# Patient Record
Sex: Female | Born: 1945 | Race: White | Hispanic: No | Marital: Married | Smoking: Never smoker
Health system: Southern US, Community
[De-identification: ages and names within clinical notes are randomized; demographics above are authoritative.]

## PROBLEM LIST (undated history)

## (undated) DIAGNOSIS — F329 Major depressive disorder, single episode, unspecified: Secondary | ICD-10-CM

## (undated) DIAGNOSIS — G20 Parkinson's disease: Secondary | ICD-10-CM

## (undated) DIAGNOSIS — R296 Repeated falls: Secondary | ICD-10-CM

## (undated) DIAGNOSIS — C50919 Malignant neoplasm of unspecified site of unspecified female breast: Secondary | ICD-10-CM

## (undated) DIAGNOSIS — I10 Essential (primary) hypertension: Secondary | ICD-10-CM

## (undated) DIAGNOSIS — F32A Depression, unspecified: Secondary | ICD-10-CM

## (undated) DIAGNOSIS — G231 Progressive supranuclear ophthalmoplegia [Steele-Richardson-Olszewski]: Secondary | ICD-10-CM

## (undated) HISTORY — DX: Depression, unspecified: F32.A

## (undated) HISTORY — DX: Progressive supranuclear ophthalmoplegia (steele-Richardson-olszewski): G23.1

## (undated) HISTORY — DX: Major depressive disorder, single episode, unspecified: F32.9

## (undated) HISTORY — PX: SPINAL FUSION: SHX223

## (undated) HISTORY — DX: Parkinson's disease: G20

## (undated) HISTORY — DX: Essential (primary) hypertension: I10

## (undated) HISTORY — DX: Malignant neoplasm of unspecified site of unspecified female breast: C50.919

---

## 1996-11-01 DIAGNOSIS — C50919 Malignant neoplasm of unspecified site of unspecified female breast: Secondary | ICD-10-CM

## 1996-11-01 HISTORY — PX: OTHER SURGICAL HISTORY: SHX169

## 1996-11-01 HISTORY — DX: Malignant neoplasm of unspecified site of unspecified female breast: C50.919

## 2008-11-01 HISTORY — PX: ANTERIOR RELEASE VERTEBRAL BODY W/ POSTERIOR FUSION: SUR290

## 2010-11-01 DIAGNOSIS — G20 Parkinson's disease: Secondary | ICD-10-CM

## 2010-11-01 DIAGNOSIS — G20A1 Parkinson's disease without dyskinesia, without mention of fluctuations: Secondary | ICD-10-CM

## 2010-11-01 HISTORY — DX: Parkinson's disease: G20

## 2010-11-01 HISTORY — DX: Parkinson's disease without dyskinesia, without mention of fluctuations: G20.A1

## 2012-10-13 ENCOUNTER — Ambulatory Visit: Payer: 59 | Attending: Neurology | Admitting: Physical Therapy

## 2012-10-13 DIAGNOSIS — R269 Unspecified abnormalities of gait and mobility: Secondary | ICD-10-CM | POA: Insufficient documentation

## 2012-10-13 DIAGNOSIS — G20 Parkinson's disease: Secondary | ICD-10-CM | POA: Insufficient documentation

## 2012-10-13 DIAGNOSIS — IMO0001 Reserved for inherently not codable concepts without codable children: Secondary | ICD-10-CM | POA: Insufficient documentation

## 2012-10-13 DIAGNOSIS — G20A1 Parkinson's disease without dyskinesia, without mention of fluctuations: Secondary | ICD-10-CM | POA: Insufficient documentation

## 2012-10-26 ENCOUNTER — Ambulatory Visit: Payer: 59 | Admitting: Physical Therapy

## 2012-10-27 ENCOUNTER — Ambulatory Visit: Payer: 59 | Admitting: Physical Therapy

## 2012-11-02 ENCOUNTER — Ambulatory Visit: Payer: 59 | Attending: Neurology | Admitting: Physical Therapy

## 2012-11-02 DIAGNOSIS — G20A1 Parkinson's disease without dyskinesia, without mention of fluctuations: Secondary | ICD-10-CM | POA: Insufficient documentation

## 2012-11-02 DIAGNOSIS — R269 Unspecified abnormalities of gait and mobility: Secondary | ICD-10-CM | POA: Insufficient documentation

## 2012-11-02 DIAGNOSIS — IMO0001 Reserved for inherently not codable concepts without codable children: Secondary | ICD-10-CM | POA: Insufficient documentation

## 2012-11-02 DIAGNOSIS — G20 Parkinson's disease: Secondary | ICD-10-CM | POA: Insufficient documentation

## 2012-11-03 ENCOUNTER — Ambulatory Visit: Payer: 59 | Admitting: Physical Therapy

## 2012-11-07 ENCOUNTER — Ambulatory Visit: Payer: 59 | Admitting: Physical Therapy

## 2012-11-09 ENCOUNTER — Ambulatory Visit: Payer: 59 | Admitting: Physical Therapy

## 2012-11-09 ENCOUNTER — Telehealth: Payer: Self-pay | Admitting: *Deleted

## 2012-11-09 NOTE — Telephone Encounter (Signed)
Confirmed 3.10.14 appt w/ pt.  Mailed before appt letter & packet to pt.  Took paperwork to Med Rec for chart.

## 2012-11-10 ENCOUNTER — Ambulatory Visit: Payer: 59 | Admitting: Physical Therapy

## 2012-11-14 ENCOUNTER — Ambulatory Visit: Payer: 59 | Admitting: Physical Therapy

## 2012-11-16 ENCOUNTER — Ambulatory Visit: Payer: 59 | Admitting: Physical Therapy

## 2012-11-17 ENCOUNTER — Ambulatory Visit: Payer: 59 | Admitting: Physical Therapy

## 2012-11-20 ENCOUNTER — Other Ambulatory Visit: Payer: Self-pay | Admitting: *Deleted

## 2012-11-20 DIAGNOSIS — C50919 Malignant neoplasm of unspecified site of unspecified female breast: Secondary | ICD-10-CM

## 2012-11-20 DIAGNOSIS — C50911 Malignant neoplasm of unspecified site of right female breast: Secondary | ICD-10-CM | POA: Insufficient documentation

## 2012-11-21 ENCOUNTER — Ambulatory Visit: Payer: 59 | Admitting: Occupational Therapy

## 2012-11-21 ENCOUNTER — Ambulatory Visit: Payer: 59 | Admitting: Physical Therapy

## 2012-11-22 ENCOUNTER — Ambulatory Visit: Payer: 59 | Admitting: Physical Therapy

## 2012-11-24 ENCOUNTER — Ambulatory Visit: Payer: 59 | Admitting: Physical Therapy

## 2012-11-27 ENCOUNTER — Ambulatory Visit: Payer: 59 | Admitting: Physical Therapy

## 2012-11-28 ENCOUNTER — Ambulatory Visit: Payer: 59 | Admitting: Physical Therapy

## 2012-11-28 ENCOUNTER — Ambulatory Visit: Payer: 59 | Admitting: Occupational Therapy

## 2012-11-29 ENCOUNTER — Ambulatory Visit: Payer: 59 | Admitting: Physical Therapy

## 2012-11-29 ENCOUNTER — Ambulatory Visit: Payer: 59 | Admitting: Occupational Therapy

## 2012-12-01 ENCOUNTER — Ambulatory Visit: Payer: 59 | Admitting: Physical Therapy

## 2012-12-04 ENCOUNTER — Ambulatory Visit: Payer: 59 | Attending: Neurology | Admitting: Occupational Therapy

## 2012-12-04 DIAGNOSIS — R269 Unspecified abnormalities of gait and mobility: Secondary | ICD-10-CM | POA: Insufficient documentation

## 2012-12-04 DIAGNOSIS — G20 Parkinson's disease: Secondary | ICD-10-CM | POA: Insufficient documentation

## 2012-12-04 DIAGNOSIS — IMO0001 Reserved for inherently not codable concepts without codable children: Secondary | ICD-10-CM | POA: Insufficient documentation

## 2012-12-04 DIAGNOSIS — G20A1 Parkinson's disease without dyskinesia, without mention of fluctuations: Secondary | ICD-10-CM | POA: Insufficient documentation

## 2012-12-06 ENCOUNTER — Ambulatory Visit: Payer: 59 | Admitting: Occupational Therapy

## 2012-12-11 ENCOUNTER — Ambulatory Visit: Payer: 59 | Admitting: Occupational Therapy

## 2012-12-13 ENCOUNTER — Ambulatory Visit: Payer: 59 | Admitting: Occupational Therapy

## 2012-12-19 ENCOUNTER — Ambulatory Visit: Payer: 59 | Admitting: Occupational Therapy

## 2012-12-21 ENCOUNTER — Ambulatory Visit: Payer: 59 | Admitting: Occupational Therapy

## 2013-01-08 ENCOUNTER — Encounter: Payer: Self-pay | Admitting: Oncology

## 2013-01-08 ENCOUNTER — Ambulatory Visit: Payer: 59

## 2013-01-08 ENCOUNTER — Telehealth: Payer: Self-pay | Admitting: Oncology

## 2013-01-08 ENCOUNTER — Ambulatory Visit (HOSPITAL_BASED_OUTPATIENT_CLINIC_OR_DEPARTMENT_OTHER): Payer: 59 | Admitting: Oncology

## 2013-01-08 ENCOUNTER — Other Ambulatory Visit (HOSPITAL_BASED_OUTPATIENT_CLINIC_OR_DEPARTMENT_OTHER): Payer: 59 | Admitting: Lab

## 2013-01-08 VITALS — BP 174/81 | HR 111 | Temp 98.3°F | Resp 20 | Ht 61.0 in | Wt 81.4 lb

## 2013-01-08 DIAGNOSIS — C50919 Malignant neoplasm of unspecified site of unspecified female breast: Secondary | ICD-10-CM

## 2013-01-08 DIAGNOSIS — Z853 Personal history of malignant neoplasm of breast: Secondary | ICD-10-CM

## 2013-01-08 LAB — COMPREHENSIVE METABOLIC PANEL (CC13)
AST: 20 U/L (ref 5–34)
Alkaline Phosphatase: 84 U/L (ref 40–150)
BUN: 18.7 mg/dL (ref 7.0–26.0)
Creatinine: 0.8 mg/dL (ref 0.6–1.1)

## 2013-01-08 LAB — CBC WITH DIFFERENTIAL/PLATELET
Basophils Absolute: 0.1 10*3/uL (ref 0.0–0.1)
EOS%: 1.3 % (ref 0.0–7.0)
HCT: 38 % (ref 34.8–46.6)
HGB: 12.9 g/dL (ref 11.6–15.9)
MCH: 30.8 pg (ref 25.1–34.0)
MCV: 90.7 fL (ref 79.5–101.0)
MONO%: 7.7 % (ref 0.0–14.0)
NEUT%: 74.1 % (ref 38.4–76.8)
RDW: 13.9 % (ref 11.2–14.5)

## 2013-01-08 NOTE — Progress Notes (Signed)
Miranda [name] [id number] [birth year] 67 y.o. 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 Patient is reestablishing her oncologic care  STAGE:  Right Breast, lower inner quadrant, diagnosed 1998 T54mic N0M0 Invasive ductal carcinoma grade 1 ER positive PR positive  REFERRING PHYSICIAN: Dr. Thayer Headings  HISTORY OF PRESENT ILLNESS:  Miranda [name] is a 67 y.o. female.  Who in 1998 underwent a screening mammogram that showed calcifications and eventually she had a lumpectomy that showed intraductal carcinoma with microscopic invasive ductal carcinoma grade 1 ER positive PR positive in the right lower inner quadrant. She was staged as a stage IA. She had node-negative disease. She went on to receive adjuvant radiation therapy to the right breast.. She then received 5 years of tamoxifen adjuvantly. Patient has had multiple breast biopsies in 06/02/2009 she had a left biopsy with dense nodule. Ductal and perilobular fibrosis an intraductal microcalcifications. She had a lumpectomy. 06/15/2007 patient again had left and right breast biopsies that showed fibrocystic changes and periductal and perilobular fibrosis. She has been followed by her oncologist in [place]. She is now relocating to [place] and transferring her care here. Overall she's doing well without any complaints appear   Past Medical History: Past Medical History  Diagnosis Date  . Breast cancer 1998    lumpectomy  . Parkinson's disease 2012  . Depression   . Hypertension     Past Surgical History: Past Surgical History  Procedure Laterality Date  . Lumpectomy right Right 1998    stage IA, DCIS with micrinvasion, ER+/PR+, , right breast lower inner quadrant  . Cesarean  section  1975  . Anterior release vertebral body w/ posterior fusion  2010    L1-S1    Family History: Family History  Problem Relation Age of Onset  . Stroke Father   . Cancer Sister   . Cancer Son 72    leiomyosarcoma    Social History History  Substance Use Topics  . Smoking status: Never Smoker   . Smokeless tobacco: Never Used  . Alcohol Use: 3.6 oz/week    6 Glasses of wine per week    Allergies: Allergies  Allergen Reactions  . Codeine     Hyper  . Demerol (Meperidine)     hyper  . Valium (Diazepam)     hyper    Current Medications: Current Outpatient Prescriptions  Medication Sig Dispense Refill  . buPROPion (WELLBUTRIN XL) 300 MG 24 hr tablet Take 300 mg by mouth daily.      . Calcium Citrate (CITRACAL PO) Take 630 mg by mouth.      . carbidopa-levodopa (SINEMET IR) 25-100 MG per tablet Take 2 tablets by mouth 3 (three) times daily.      . clonazePAM (KLONOPIN) 1 MG tablet Take 1 mg by mouth 2 (two) times daily as needed for anxiety.      . Fesoterodine Fumarate (TOVIAZ PO) Take 4 mg by mouth daily.      Marland Kitchen HYDROcodone-acetaminophen (LORTAB) 10-500 MG per tablet Take 1 tablet by mouth every 6 (six) hours as needed for pain.      Marland Kitchen losartan (COZAAR) 50 MG tablet Take 50 mg by mouth daily.      . mirtazapine (REMERON) 15 MG tablet Take 15 mg by mouth at bedtime.      Marland Kitchen  Multiple Vitamins-Minerals (CENTRUM SILVER PO) Take 1 tablet by mouth daily.      . polyethylene glycol (MIRALAX / GLYCOLAX) packet Take 17 g by mouth daily.       No current facility-administered medications for this visit.    OB/GYN History:menarche at 21, menopause in 1998, HRT for 8 months, G1P1 first live at 74  Fertility Discussion:N/A Prior History of Cancer: N/A  Health Maintenance:  Colonoscopy 2009 Bone Density 2013 Last PAP smear May 2013  ECOG PERFORMANCE STATUS: 1 - Symptomatic but completely ambulatory  Genetic Counseling/testing:not recommended at this time  REVIEW  OF SYSTEMS:  A comprehensive review of systems was negative.  PHYSICAL EXAMINATION: Blood pressure 174/81, pulse 111, temperature 98.3 F (36.8 C), temperature source Oral, resp. rate 20, height 5\' 1"  (1.549 m), weight 81 lb 6.4 oz (36.923 kg).  ZOX:WRUEA, healthy and no distress SKIN: skin color, texture, turgor are normal HEAD: Normocephalic EYES: PERRLA, EOMI EARS: External ears normal OROPHARYNX:no exudate and no erythema  NECK: supple, no adenopathy LYMPH:  no palpable lymphadenopathy, no hepatosplenomegaly BREAST:surgical scars noted bilaterally and the breasts. LUNGS: clear to auscultation  HEART: regular rate & rhythm ABDOMEN:abdomen soft, non-tender, normal bowel sounds and no masses or organomegaly BACK: No CVA tenderness EXTREMITIES:no edema, no clubbing, no cyanosis  NEURO: alert & oriented x 3 with fluent speech, no focal motor/sensory deficits, gait normal, reflexes normal and symmetric     STUDIES/RESULTS: No results found.   LABS:    Chemistry      Component Value Date/Time   NA 140 01/08/2013 1458   K 3.9 01/08/2013 1458   CL 100 01/08/2013 1458   CO2 30* 01/08/2013 1458   BUN 18.7 01/08/2013 1458   CREATININE 0.8 01/08/2013 1458      Component Value Date/Time   CALCIUM 9.8 01/08/2013 1458   ALKPHOS 84 01/08/2013 1458   AST 20 01/08/2013 1458   ALT 19 01/08/2013 1458   BILITOT 0.44 01/08/2013 1458      Lab Results  Component Value Date   WBC 7.9 01/08/2013   HGB 12.9 01/08/2013   HCT 38.0 01/08/2013   MCV 90.7 01/08/2013   PLT 324 01/08/2013   PATHOLOGY:  ASSESSMENT    67 year old female with history of T1 microscopic N0 M0 ductal carcinoma in situ with microscopic invasive disease. Tumor was ER positive PR positive in the right lower inner quadrant. Patient is status post lumpectomy followed by radiation therapy and 5 years of tamoxifen. Overall patient has done well. She has had several breast biopsies performed including 2008 and 2010 but all of  these were negative for malignancies. Her last normal mammogram performed was on 01/21/2012 last DEXA scan was 2011. Patient also has a history of Parkinson's disease. This is well controlled.clinically she is without any evidence of recurrent breast cancer.  Clinical Trial Eligibility: no Multidisciplinary conference discussion no     PLAN:    #1 patient will be followed by [place] once a year.  #2 I will order her mammograms and she is due for him this year.  #3 she knows to call me with any problems questions or concerns.      Thank you so much for allowing me to participate in the care of Nezzie Manera. I will continue to follow up the patient with you and assist in her care.  All questions were answered. The patient knows to call the clinic with any problems, questions or concerns. We can certainly see the patient much sooner  if necessary.  I spent 60 minutes counseling the patient face to face. The total time spent in the appointment was 60 minutes.   Drue Second, MD Medical/Oncology [facility] [phone] (beeper) [phone] (Office)  01/08/2013, 4:11 PM

## 2013-01-08 NOTE — Telephone Encounter (Signed)
gv pt appt schedule for March 2015 and mammo for 02/08/13.

## 2013-01-08 NOTE — Patient Instructions (Addendum)
Doing well.  [facility] after March 22, [address]  I will see you back in 1 year

## 2013-02-08 ENCOUNTER — Ambulatory Visit
Admission: RE | Admit: 2013-02-08 | Discharge: 2013-02-08 | Disposition: A | Discharge status: Home / Self Care | Payer: 59 | Source: Ambulatory Visit | Attending: Oncology | Admitting: Oncology

## 2013-02-08 DIAGNOSIS — C50919 Malignant neoplasm of unspecified site of unspecified female breast: Secondary | ICD-10-CM

## 2013-03-02 ENCOUNTER — Encounter: Payer: Self-pay | Admitting: *Deleted

## 2013-03-02 NOTE — Progress Notes (Signed)
Mailed after appt letter to pt. 

## 2013-06-18 ENCOUNTER — Ambulatory Visit: Payer: 59 | Attending: Neurology | Admitting: Occupational Therapy

## 2013-06-18 ENCOUNTER — Ambulatory Visit: Payer: 59 | Admitting: Physical Therapy

## 2013-06-18 DIAGNOSIS — G20A1 Parkinson's disease without dyskinesia, without mention of fluctuations: Secondary | ICD-10-CM | POA: Insufficient documentation

## 2013-06-18 DIAGNOSIS — R269 Unspecified abnormalities of gait and mobility: Secondary | ICD-10-CM | POA: Insufficient documentation

## 2013-06-18 DIAGNOSIS — G20 Parkinson's disease: Secondary | ICD-10-CM | POA: Insufficient documentation

## 2013-06-18 DIAGNOSIS — IMO0001 Reserved for inherently not codable concepts without codable children: Secondary | ICD-10-CM | POA: Insufficient documentation

## 2013-07-23 ENCOUNTER — Ambulatory Visit: Payer: Medicare Other | Admitting: Physical Therapy

## 2013-07-25 ENCOUNTER — Ambulatory Visit: Payer: Medicare Other | Attending: Neurology | Admitting: Physical Therapy

## 2013-07-25 DIAGNOSIS — IMO0001 Reserved for inherently not codable concepts without codable children: Secondary | ICD-10-CM | POA: Insufficient documentation

## 2013-07-25 DIAGNOSIS — G20A1 Parkinson's disease without dyskinesia, without mention of fluctuations: Secondary | ICD-10-CM | POA: Insufficient documentation

## 2013-07-25 DIAGNOSIS — G20 Parkinson's disease: Secondary | ICD-10-CM | POA: Insufficient documentation

## 2013-07-25 DIAGNOSIS — R269 Unspecified abnormalities of gait and mobility: Secondary | ICD-10-CM | POA: Insufficient documentation

## 2013-07-27 ENCOUNTER — Ambulatory Visit: Payer: Medicare Other | Admitting: Physical Therapy

## 2013-07-30 ENCOUNTER — Ambulatory Visit: Payer: Medicare Other | Admitting: Occupational Therapy

## 2013-08-01 ENCOUNTER — Ambulatory Visit: Payer: Medicare Other | Attending: Neurology | Admitting: Physical Therapy

## 2013-08-01 ENCOUNTER — Ambulatory Visit: Payer: Medicare Other | Admitting: Occupational Therapy

## 2013-08-01 DIAGNOSIS — G20A1 Parkinson's disease without dyskinesia, without mention of fluctuations: Secondary | ICD-10-CM | POA: Insufficient documentation

## 2013-08-01 DIAGNOSIS — G20 Parkinson's disease: Secondary | ICD-10-CM | POA: Insufficient documentation

## 2013-08-01 DIAGNOSIS — IMO0001 Reserved for inherently not codable concepts without codable children: Secondary | ICD-10-CM | POA: Insufficient documentation

## 2013-08-01 DIAGNOSIS — R269 Unspecified abnormalities of gait and mobility: Secondary | ICD-10-CM | POA: Insufficient documentation

## 2013-08-03 ENCOUNTER — Ambulatory Visit: Payer: Medicare Other | Admitting: Physical Therapy

## 2013-08-06 ENCOUNTER — Ambulatory Visit: Payer: Medicare Other | Admitting: Occupational Therapy

## 2013-08-06 ENCOUNTER — Ambulatory Visit: Payer: Medicare Other | Admitting: Physical Therapy

## 2013-08-08 ENCOUNTER — Ambulatory Visit: Payer: Medicare Other | Admitting: Occupational Therapy

## 2013-08-08 ENCOUNTER — Ambulatory Visit: Payer: Medicare Other | Admitting: Physical Therapy

## 2013-08-09 ENCOUNTER — Ambulatory Visit: Payer: Medicare Other | Admitting: Physical Therapy

## 2013-08-09 ENCOUNTER — Ambulatory Visit: Payer: Medicare Other | Admitting: Occupational Therapy

## 2013-08-14 ENCOUNTER — Encounter: Payer: Medicare Other | Admitting: Occupational Therapy

## 2013-08-14 ENCOUNTER — Ambulatory Visit: Payer: Medicare Other | Admitting: Physical Therapy

## 2013-08-15 ENCOUNTER — Ambulatory Visit: Payer: Medicare Other | Admitting: Occupational Therapy

## 2013-08-15 ENCOUNTER — Ambulatory Visit: Payer: Medicare Other | Admitting: Physical Therapy

## 2013-08-17 ENCOUNTER — Ambulatory Visit: Payer: Medicare Other | Admitting: Occupational Therapy

## 2013-08-17 ENCOUNTER — Emergency Department (HOSPITAL_COMMUNITY)
Admission: EM | Admit: 2013-08-17 | Discharge: 2013-08-17 | Disposition: A | Discharge status: Home / Self Care | Payer: Medicare Other | Attending: Emergency Medicine | Admitting: Emergency Medicine

## 2013-08-17 ENCOUNTER — Encounter (HOSPITAL_COMMUNITY): Payer: Self-pay | Admitting: Emergency Medicine

## 2013-08-17 ENCOUNTER — Ambulatory Visit: Payer: Medicare Other | Admitting: Physical Therapy

## 2013-08-17 DIAGNOSIS — Z9181 History of falling: Secondary | ICD-10-CM | POA: Insufficient documentation

## 2013-08-17 DIAGNOSIS — S0180XA Unspecified open wound of other part of head, initial encounter: Secondary | ICD-10-CM | POA: Insufficient documentation

## 2013-08-17 DIAGNOSIS — W19XXXA Unspecified fall, initial encounter: Secondary | ICD-10-CM

## 2013-08-17 DIAGNOSIS — Z79899 Other long term (current) drug therapy: Secondary | ICD-10-CM | POA: Insufficient documentation

## 2013-08-17 DIAGNOSIS — S0181XA Laceration without foreign body of other part of head, initial encounter: Secondary | ICD-10-CM

## 2013-08-17 DIAGNOSIS — F3289 Other specified depressive episodes: Secondary | ICD-10-CM | POA: Insufficient documentation

## 2013-08-17 DIAGNOSIS — I10 Essential (primary) hypertension: Secondary | ICD-10-CM | POA: Insufficient documentation

## 2013-08-17 DIAGNOSIS — Y9389 Activity, other specified: Secondary | ICD-10-CM | POA: Insufficient documentation

## 2013-08-17 DIAGNOSIS — W1809XA Striking against other object with subsequent fall, initial encounter: Secondary | ICD-10-CM | POA: Insufficient documentation

## 2013-08-17 DIAGNOSIS — G20 Parkinson's disease: Secondary | ICD-10-CM | POA: Insufficient documentation

## 2013-08-17 DIAGNOSIS — F329 Major depressive disorder, single episode, unspecified: Secondary | ICD-10-CM | POA: Insufficient documentation

## 2013-08-17 DIAGNOSIS — G20A1 Parkinson's disease without dyskinesia, without mention of fluctuations: Secondary | ICD-10-CM | POA: Insufficient documentation

## 2013-08-17 DIAGNOSIS — Y92009 Unspecified place in unspecified non-institutional (private) residence as the place of occurrence of the external cause: Secondary | ICD-10-CM | POA: Insufficient documentation

## 2013-08-17 MED ORDER — LIDOCAINE-EPINEPHRINE-TETRACAINE (LET) SOLUTION
3.0000 mL | Freq: Once | NASAL | Status: AC
  Administered 2013-08-17: 3 mL via TOPICAL
  Filled 2013-08-17: qty 3

## 2013-08-17 NOTE — ED Provider Notes (Signed)
CSN: [id number]     Arrival date & time 08/17/13  7829 History   First MD Initiated Contact with Patient 08/17/13 [phone]     Chief Complaint  Patient presents with  . Head Injury   (Consider location/radiation/quality/duration/timing/severity/associated sxs/prior Treatment) HPI Patient states she has Parkinson's disease and she falls a lot. She states she has constant bruising on her body from falling. She states this morning she fell in the bathroom and hit her head on a cabinet. She denies any loss of consciousness. She denies neck pain or any other pain. She states she does have a new bruise on her by and she has a laceration on her right forehead. She does not have any nausea or vomiting or other neurological symptoms. Husband reports this happened about 45 minutes prior to arrival.  PCP Dr B Thea Silversmith Neurologist Dr Roselind Rily in [facility]  Past Medical History  Diagnosis Date  . Breast cancer 1998    lumpectomy  . Parkinson's disease 2012  . Depression   . Hypertension    Past Surgical History  Procedure Laterality Date  . Lumpectomy right Right 1998    stage IA, DCIS with micrinvasion, ER+/PR+, , right breast lower inner quadrant  . Cesarean section  1975  . Anterior release vertebral body w/ posterior fusion  2010    L1-S1   Family History  Problem Relation Age of Onset  . Stroke Father   . Cancer Sister   . Cancer Son 42    leiomyosarcoma   History  Substance Use Topics  . Smoking status: Never Smoker   . Smokeless tobacco: Never Used  . Alcohol Use: 3.6 oz/week    6 Glasses of wine per week  lives at home Lives with spouse   OB History   Grav Para Term Preterm Abortions TAB SAB Ect Mult Living                 Review of Systems  All other systems reviewed and are negative.    Allergies  Codeine; Demerol; and Valium  Home Medications   Current Outpatient Rx  Name  Route  Sig  Dispense  Refill  . buPROPion (WELLBUTRIN XL) 300 MG 24 hr tablet    Oral   Take 300 mg by mouth daily.         . Calcium Citrate (CITRACAL PO)   Oral   Take 630 mg by mouth.         . carbidopa-levodopa (SINEMET IR) 25-100 MG per tablet   Oral   Take 2 tablets by mouth 3 (three) times daily.         . clonazePAM (KLONOPIN) 1 MG tablet   Oral   Take 1 mg by mouth 2 (two) times daily as needed for anxiety.         . Fesoterodine Fumarate (TOVIAZ PO)   Oral   Take 4 mg by mouth daily.         Marland Kitchen HYDROcodone-acetaminophen (LORTAB) 10-500 MG per tablet   Oral   Take 1 tablet by mouth every 6 (six) hours as needed for pain.         Marland Kitchen losartan (COZAAR) 50 MG tablet   Oral   Take 50 mg by mouth daily.         . mirtazapine (REMERON) 15 MG tablet   Oral   Take 15 mg by mouth at bedtime.         . Multiple Vitamins-Minerals (CENTRUM SILVER PO)  Oral   Take 1 tablet by mouth daily.         . polyethylene glycol (MIRALAX / GLYCOLAX) packet   Oral   Take 17 g by mouth daily.          BP 155/81  Pulse 83  Resp 17  SpO2 100%  Vital signs normal except hypertension  Physical Exam  Nursing note and vitals reviewed. Constitutional: She is oriented to person, place, and time. She appears well-developed and well-nourished.  Non-toxic appearance. She does not appear ill. No distress.  HENT:  Head: Normocephalic.    Right Ear: External ear normal.  Left Ear: External ear normal.  Nose: Nose normal. No mucosal edema or rhinorrhea.  Mouth/Throat: Oropharynx is clear and moist and mucous membranes are normal. No dental abscesses or uvula swelling.  3 cm linear almost vertically oriented laceration in her right forehead that has controlled bleeding.   Eyes: Conjunctivae and EOM are normal. Pupils are equal, round, and reactive to light.  Neck: Normal range of motion and full passive range of motion without pain. Neck supple.  Pulmonary/Chest: Effort normal. No respiratory distress. She has no rhonchi. She exhibits no crepitus.   Abdominal: Normal appearance.  Musculoskeletal: Normal range of motion. She exhibits no edema and no tenderness.  Moves all extremities well. Pt has bruising on her mid lateral right thigh, one linear, one round  Neurological: She is alert and oriented to person, place, and time. She has normal strength. No cranial nerve deficit.  Skin: Skin is warm, dry and intact. No rash noted. No erythema. No pallor.  Psychiatric: She has a normal mood and affect. Her speech is normal and behavior is normal. Her mood appears not anxious.    ED Course  Procedures (including critical care time)  Medications  lidocaine-EPINEPHrine-tetracaine (LET) solution (3 mLs Topical Given 08/17/13 0918)   We discuss after care and problems to return to the ED (head injury sheet)  LACERATION REPAIR Performed by: Devoria Albe L Authorized by: Ward Givens Consent: Verbal consent obtained. Risks and benefits: risks, benefits and alternatives were discussed Consent given by: patient Patient identity confirmed: provided demographic data Prepped and Draped in normal sterile fashion Wound explored  Laceration Location: right forehead   Laceration Length: 3 cm  No Foreign Bodies seen or palpated  Local anesthetic:LET  Amount of cleaning: standard  Skin closure: dermabond  Patient tolerance: Patient tolerated the procedure well with no immediate complications.     MDM   1. Fall at home, initial encounter   2. Laceration of forehead, initial encounter     Plan discharge   Devoria Albe, MD, Franz Dell, MD 08/17/13 1013

## 2013-08-17 NOTE — ED Notes (Signed)
Per pt fell and hit counter in bathroom. Pt has 3 cm laceration on right frontal region of head. Pt reports pain of 7/10 at injured area but denies headache. Pt has hx of parkinsons. Pt denies any other injuries.

## 2013-08-19 ENCOUNTER — Emergency Department (HOSPITAL_COMMUNITY): Payer: Medicare Other

## 2013-08-19 ENCOUNTER — Emergency Department (HOSPITAL_COMMUNITY)
Admission: EM | Admit: 2013-08-19 | Discharge: 2013-08-19 | Disposition: A | Discharge status: Home / Self Care | Payer: Medicare Other | Attending: Emergency Medicine | Admitting: Emergency Medicine

## 2013-08-19 ENCOUNTER — Encounter (HOSPITAL_COMMUNITY): Payer: Self-pay | Admitting: Emergency Medicine

## 2013-08-19 DIAGNOSIS — F3289 Other specified depressive episodes: Secondary | ICD-10-CM | POA: Insufficient documentation

## 2013-08-19 DIAGNOSIS — S161XXA Strain of muscle, fascia and tendon at neck level, initial encounter: Secondary | ICD-10-CM

## 2013-08-19 DIAGNOSIS — S139XXA Sprain of joints and ligaments of unspecified parts of neck, initial encounter: Secondary | ICD-10-CM | POA: Insufficient documentation

## 2013-08-19 DIAGNOSIS — S7011XD Contusion of right thigh, subsequent encounter: Secondary | ICD-10-CM

## 2013-08-19 DIAGNOSIS — S0003XA Contusion of scalp, initial encounter: Secondary | ICD-10-CM | POA: Insufficient documentation

## 2013-08-19 DIAGNOSIS — S0180XA Unspecified open wound of other part of head, initial encounter: Secondary | ICD-10-CM | POA: Insufficient documentation

## 2013-08-19 DIAGNOSIS — Y92009 Unspecified place in unspecified non-institutional (private) residence as the place of occurrence of the external cause: Secondary | ICD-10-CM | POA: Insufficient documentation

## 2013-08-19 DIAGNOSIS — S7010XA Contusion of unspecified thigh, initial encounter: Secondary | ICD-10-CM | POA: Insufficient documentation

## 2013-08-19 DIAGNOSIS — G030 Nonpyogenic meningitis: Secondary | ICD-10-CM | POA: Insufficient documentation

## 2013-08-19 DIAGNOSIS — R609 Edema, unspecified: Secondary | ICD-10-CM | POA: Insufficient documentation

## 2013-08-19 DIAGNOSIS — W010XXA Fall on same level from slipping, tripping and stumbling without subsequent striking against object, initial encounter: Secondary | ICD-10-CM | POA: Insufficient documentation

## 2013-08-19 DIAGNOSIS — S0181XD Laceration without foreign body of other part of head, subsequent encounter: Secondary | ICD-10-CM

## 2013-08-19 DIAGNOSIS — I10 Essential (primary) hypertension: Secondary | ICD-10-CM | POA: Insufficient documentation

## 2013-08-19 DIAGNOSIS — Z79899 Other long term (current) drug therapy: Secondary | ICD-10-CM | POA: Insufficient documentation

## 2013-08-19 DIAGNOSIS — F329 Major depressive disorder, single episode, unspecified: Secondary | ICD-10-CM | POA: Insufficient documentation

## 2013-08-19 DIAGNOSIS — Y9389 Activity, other specified: Secondary | ICD-10-CM | POA: Insufficient documentation

## 2013-08-19 DIAGNOSIS — S0083XA Contusion of other part of head, initial encounter: Secondary | ICD-10-CM

## 2013-08-19 MED ORDER — HYDROCODONE-ACETAMINOPHEN 5-325 MG PO TABS: 1.0000 | ORAL_TABLET | ORAL | Status: DC | PRN

## 2013-08-19 MED ORDER — HYDROCODONE-ACETAMINOPHEN 5-325 MG PO TABS
2.0000 | ORAL_TABLET | Freq: Once | ORAL | Status: AC
  Administered 2013-08-19: 2 via ORAL
  Filled 2013-08-19: qty 2

## 2013-08-19 NOTE — ED Notes (Signed)
Patient discharged to home. DC instructions given with husband at bedside. No concerns voiced. Left unit in wheelchair pushed by spouse. Let in good condition. Vwilliams, rn.

## 2013-08-19 NOTE — ED Notes (Signed)
Pt with history of fall on Friday oct 17. Visited the Ed, got treated and sent home. now returns with a complaint of falling and hitting head in previously injured area. Has a bump noted to right forehead. Minimum bleeding noted at site. Pt also has a large bruise to right hip and right lateral thigh. Reports the bruises are from frequent falls she has had. Reports discomfort to right hip. Husband at bedside.  Vwilliams,rn.

## 2013-08-19 NOTE — ED Provider Notes (Signed)
CSN: [id number]     Arrival date & time 08/19/13  0808 History   First MD Initiated Contact with Patient 08/19/13 [phone]     Chief Complaint  Patient presents with  . Fall    pt with parkinsons, reports falling in bathroom and hitting head at a previous site that was injured 2 days ago   (Consider location/radiation/quality/duration/timing/severity/associated sxs/prior Treatment) HPI  A 67 year old female presenting after a fall. This happened last night. Patient was ambulating to the bathroom when she lost her balance and fell. She attributes this to being off balance secondary to her Parkinson's. She did hit her head. She did  open laceration she states sustained on the other recent fall. Her new complaints at this current fall of some pain in her right. Denies any acute pain otherwise. No fevers or chills. No acute numbness or tingling. No use of blood thinning medication.  Past Medical History  Diagnosis Date  . Breast cancer 1998    lumpectomy  . Parkinson's disease 2012  . Depression   . Hypertension    Past Surgical History  Procedure Laterality Date  . Lumpectomy right Right 1998    stage IA, DCIS with micrinvasion, ER+/PR+, , right breast lower inner quadrant  . Cesarean section  1975  . Anterior release vertebral body w/ posterior fusion  2010    L1-S1  . Spinal fusion     Family History  Problem Relation Age of Onset  . Stroke Father   . Cancer Sister   . Cancer Son 84    leiomyosarcoma   History  Substance Use Topics  . Smoking status: Never Smoker   . Smokeless tobacco: Never Used  . Alcohol Use: 0.0 oz/week    0 Glasses of wine per week   OB History   Grav Para Term Preterm Abortions TAB SAB Ect Mult Living                 Review of Systems  All systems reviewed and negative, other than as noted in HPI.   Allergies  Codeine; Demerol; and Valium  Home Medications   Current Outpatient Rx  Name  Route  Sig  Dispense  Refill  . alendronate  (FOSAMAX) 70 MG tablet   Oral   Take 70 mg by mouth every 7 (seven) days. Take with a full glass of water on an empty stomach.         Marland Kitchen buPROPion (WELLBUTRIN XL) 300 MG 24 hr tablet   Oral   Take 300 mg by mouth daily.         . Calcium Citrate (CITRACAL PO)   Oral   Take 630 mg by mouth daily.          . carbidopa-levodopa (SINEMET IR) 25-100 MG per tablet   Oral   Take 2 tablets by mouth 3 (three) times daily.         . carboxymethylcellulose (REFRESH PLUS) 0.5 % SOLN   Both Eyes   Place 1 drop into both eyes every 2 (two) hours as needed (dry eyes).         . clonazePAM (KLONOPIN) 1 MG tablet   Oral   Take 1 mg by mouth at bedtime.          Marland Kitchen Fesoterodine Fumarate (TOVIAZ PO)   Oral   Take 4 mg by mouth every evening.          Marland Kitchen HYDROcodone-acetaminophen (NORCO) 10-325 MG per tablet   Oral  Take 1 tablet by mouth every 6 (six) hours as needed.         Marland Kitchen losartan (COZAAR) 50 MG tablet   Oral   Take 100 mg by mouth daily.          . mirtazapine (REMERON) 15 MG tablet   Oral   Take 15 mg by mouth at bedtime.         . Multiple Vitamins-Minerals (CENTRUM SILVER PO)   Oral   Take 1 tablet by mouth daily.         . polyethylene glycol (MIRALAX / GLYCOLAX) packet   Oral   Take 17 g by mouth daily as needed (constipation).          Marland Kitchen HYDROcodone-acetaminophen (NORCO/VICODIN) 5-325 MG per tablet   Oral   Take 1 tablet by mouth every 4 (four) hours as needed for pain.   10 tablet   0    BP 147/83  Pulse 101  Temp(Src) 98.4 F (36.9 C) (Oral)  Resp 20  SpO2 100% Physical Exam  Nursing note and vitals reviewed. Constitutional: She appears well-developed and well-nourished. No distress.  HENT:  Head: Normocephalic.  Laceration to the right forehead with overlying tissue adhesive. Inferior aspect of this wound has some mild gaping. No active bleeding. Mild R periorbital swelling.   Eyes: Conjunctivae are normal. Right eye exhibits no  discharge. Left eye exhibits no discharge.  Neck: Neck supple.  Cardiovascular: Normal rate, regular rhythm and normal heart sounds.  Exam reveals no gallop and no friction rub.   No murmur heard. Pulmonary/Chest: Effort normal and breath sounds normal. No respiratory distress.  Abdominal: Soft. She exhibits no distension. There is no tenderness.  Musculoskeletal:  Ecchymosis/hematoma to right lateral thigh. Mild tenderness to palpation right lateral neck. No midline spinal tenderness. No bony tenderness the extremities. No apparent pain with range of motion of the large joints.  Neurological: She is alert. No cranial nerve deficit. She exhibits normal muscle tone. Coordination normal.  Skin: Skin is warm and dry.  Psychiatric: She has a normal mood and affect. Her behavior is normal. Thought content normal.    ED Course  Procedures (including critical care time) Labs Review Labs Reviewed - No data to display Imaging Review Dg Cervical Spine Complete  08/19/2013   CLINICAL DATA:  FELL. RIGHT-SIDED NECK PAIN.  EXAM: CERVICAL SPINE  4+ VIEWS  COMPARISON:  None.  FINDINGS: SEVERE DEGENERATIVE CERVICAL SPONDYLOSIS WITH MULTILEVEL DISC DISEASE AND FACET DISEASE MOST NOTABLE AT C4-5, C5-6 AND C6-7. THE OVERALL ALIGNMENT IS MAINTAINED. NO ACUTE FRACTURE OR ABNORMAL PREVERTEBRAL SOFT TISSUE SWELLING. THE OBLIQUE FILMS DEMONSTRATE UNCINATE SPURRING CHANGES AND MILD BONY FORAMINAL NARROWING AT C4-5, C5-6 AND C6-7. THE C1-2 ARTICULATIONS ARE MAINTAINED. THE DENS IS NORMAL. THE LUNG APICES ARE CLEAR.  IMPRESSION: DEGENERATIVE CERVICAL SPONDYLOSIS WITH DISC DISEASE AND FACET DISEASE MAINLY AT C4-5 AND C6-7.  NO ACUTE BONY FINDINGS.   Electronically Signed   By: Loralie Champagne M.D.   On: 08/19/2013 09:42    EKG Interpretation   None       MDM   1. Cervical strain, initial encounter   2. Facial contusion, initial encounter   3. Forehead laceration, subsequent encounter   4. Thigh hematoma, right,  subsequent encounter    67 year old female presenting after a fall. Mechanical from instability secondary to her Parkinson's. Nonfocal neurological examination aside from resting tremor. Imaging unremarkable. She additionally reopened a laceration which was recently include. No active bleeding. Given age  of wound, will leave to heal by secondary intention. As needed pain medication. She has previously tolerated Vicodin which she took for back pain.    Raeford Razor, MD 08/22/13 [phone]

## 2013-08-20 ENCOUNTER — Ambulatory Visit: Payer: Medicare Other | Admitting: Physical Therapy

## 2013-08-20 ENCOUNTER — Ambulatory Visit: Payer: Medicare Other | Admitting: Occupational Therapy

## 2013-08-20 ENCOUNTER — Encounter: Payer: Medicare Other | Admitting: Occupational Therapy

## 2013-08-22 ENCOUNTER — Ambulatory Visit: Payer: Medicare Other | Admitting: Physical Therapy

## 2013-08-22 ENCOUNTER — Ambulatory Visit: Payer: Medicare Other | Admitting: Occupational Therapy

## 2013-08-23 ENCOUNTER — Ambulatory Visit: Payer: Medicare Other | Admitting: Physical Therapy

## 2013-08-23 ENCOUNTER — Ambulatory Visit: Payer: Medicare Other | Admitting: Occupational Therapy

## 2013-08-27 ENCOUNTER — Ambulatory Visit: Payer: Medicare Other | Admitting: Occupational Therapy

## 2013-08-27 ENCOUNTER — Ambulatory Visit: Payer: Medicare Other | Admitting: Physical Therapy

## 2013-09-03 ENCOUNTER — Ambulatory Visit: Payer: Medicare Other | Attending: Neurology | Admitting: Occupational Therapy

## 2013-09-03 DIAGNOSIS — R269 Unspecified abnormalities of gait and mobility: Secondary | ICD-10-CM | POA: Insufficient documentation

## 2013-09-03 DIAGNOSIS — IMO0001 Reserved for inherently not codable concepts without codable children: Secondary | ICD-10-CM | POA: Insufficient documentation

## 2013-09-03 DIAGNOSIS — G20 Parkinson's disease: Secondary | ICD-10-CM | POA: Insufficient documentation

## 2013-09-03 DIAGNOSIS — G20A1 Parkinson's disease without dyskinesia, without mention of fluctuations: Secondary | ICD-10-CM | POA: Insufficient documentation

## 2013-09-05 ENCOUNTER — Ambulatory Visit: Payer: Medicare Other | Admitting: Occupational Therapy

## 2013-09-05 ENCOUNTER — Ambulatory Visit: Payer: Medicare Other | Admitting: Physical Therapy

## 2013-09-06 ENCOUNTER — Other Ambulatory Visit: Payer: Self-pay

## 2013-09-11 ENCOUNTER — Emergency Department (HOSPITAL_COMMUNITY)
Admission: EM | Admit: 2013-09-11 | Discharge: 2013-09-11 | Disposition: A | Discharge status: Home / Self Care | Payer: Medicare Other | Attending: Emergency Medicine | Admitting: Emergency Medicine

## 2013-09-11 ENCOUNTER — Encounter (HOSPITAL_COMMUNITY): Payer: Self-pay | Admitting: Emergency Medicine

## 2013-09-11 ENCOUNTER — Emergency Department (HOSPITAL_COMMUNITY): Payer: Medicare Other

## 2013-09-11 ENCOUNTER — Ambulatory Visit: Payer: Medicare Other | Admitting: Physical Therapy

## 2013-09-11 DIAGNOSIS — G20 Parkinson's disease: Secondary | ICD-10-CM | POA: Insufficient documentation

## 2013-09-11 DIAGNOSIS — S0003XA Contusion of scalp, initial encounter: Secondary | ICD-10-CM | POA: Insufficient documentation

## 2013-09-11 DIAGNOSIS — S8000XA Contusion of unspecified knee, initial encounter: Secondary | ICD-10-CM | POA: Insufficient documentation

## 2013-09-11 DIAGNOSIS — W010XXA Fall on same level from slipping, tripping and stumbling without subsequent striking against object, initial encounter: Secondary | ICD-10-CM | POA: Insufficient documentation

## 2013-09-11 DIAGNOSIS — Z853 Personal history of malignant neoplasm of breast: Secondary | ICD-10-CM | POA: Insufficient documentation

## 2013-09-11 DIAGNOSIS — F3289 Other specified depressive episodes: Secondary | ICD-10-CM | POA: Insufficient documentation

## 2013-09-11 DIAGNOSIS — G20A1 Parkinson's disease without dyskinesia, without mention of fluctuations: Secondary | ICD-10-CM | POA: Insufficient documentation

## 2013-09-11 DIAGNOSIS — Z7983 Long term (current) use of bisphosphonates: Secondary | ICD-10-CM | POA: Insufficient documentation

## 2013-09-11 DIAGNOSIS — Y92009 Unspecified place in unspecified non-institutional (private) residence as the place of occurrence of the external cause: Secondary | ICD-10-CM | POA: Insufficient documentation

## 2013-09-11 DIAGNOSIS — I10 Essential (primary) hypertension: Secondary | ICD-10-CM | POA: Insufficient documentation

## 2013-09-11 DIAGNOSIS — Y9301 Activity, walking, marching and hiking: Secondary | ICD-10-CM | POA: Insufficient documentation

## 2013-09-11 DIAGNOSIS — Z79899 Other long term (current) drug therapy: Secondary | ICD-10-CM | POA: Insufficient documentation

## 2013-09-11 DIAGNOSIS — S62609A Fracture of unspecified phalanx of unspecified finger, initial encounter for closed fracture: Secondary | ICD-10-CM | POA: Insufficient documentation

## 2013-09-11 DIAGNOSIS — W19XXXA Unspecified fall, initial encounter: Secondary | ICD-10-CM

## 2013-09-11 DIAGNOSIS — F329 Major depressive disorder, single episode, unspecified: Secondary | ICD-10-CM | POA: Insufficient documentation

## 2013-09-11 DIAGNOSIS — S62502A Fracture of unspecified phalanx of left thumb, initial encounter for closed fracture: Secondary | ICD-10-CM

## 2013-09-11 NOTE — ED Notes (Addendum)
Pt fell at home about 0750 hitting right side of forehead. Pt states no LOC but does have a headache and slight blurred vision. Denies any n/v neck or back pain.PT also states she jammed her left thumb.

## 2013-09-11 NOTE — ED Provider Notes (Signed)
CSN: [id number]     Arrival date & time 09/11/13  0906 History   First MD Initiated Contact with Patient 09/11/13 0920     Chief Complaint  Patient presents with  . Fall   (Consider location/radiation/quality/duration/timing/severity/associated sxs/prior Treatment) Patient is a 67 y.o. female presenting with fall. The history is provided by the patient.  Fall This is a new problem. The current episode started 1 to 2 hours ago. Episode frequency: once. The problem has not changed since onset.Pertinent negatives include no chest pain, no abdominal pain, no headaches and no shortness of breath. Nothing aggravates the symptoms. Nothing relieves the symptoms. She has tried nothing for the symptoms. The treatment provided no relief.    Past Medical History  Diagnosis Date  . Breast cancer 1998    lumpectomy  . Parkinson's disease 2012  . Depression   . Hypertension    Past Surgical History  Procedure Laterality Date  . Lumpectomy right Right 1998    stage IA, DCIS with micrinvasion, ER+/PR+, , right breast lower inner quadrant  . Cesarean section  1975  . Anterior release vertebral body w/ posterior fusion  2010    L1-S1  . Spinal fusion     Family History  Problem Relation Age of Onset  . Stroke Father   . Cancer Sister   . Cancer Son 99    leiomyosarcoma   History  Substance Use Topics  . Smoking status: Never Smoker   . Smokeless tobacco: Never Used  . Alcohol Use: 0.0 oz/week    0 Glasses of wine per week   OB History   Grav Para Term Preterm Abortions TAB SAB Ect Mult Living                 Review of Systems  Constitutional: Negative for fever.  Respiratory: Negative for cough and shortness of breath.   Cardiovascular: Negative for chest pain.  Gastrointestinal: Negative for vomiting and abdominal pain.  Neurological: Negative for headaches.  All other systems reviewed and are negative.    Allergies  Codeine; Demerol; and Valium  Home Medications    Current Outpatient Rx  Name  Route  Sig  Dispense  Refill  . alendronate (FOSAMAX) 70 MG tablet   Oral   Take 70 mg by mouth every 7 (seven) days. Take with a full glass of water on an empty stomach.         Marland Kitchen buPROPion (WELLBUTRIN XL) 300 MG 24 hr tablet   Oral   Take 300 mg by mouth daily.         . Calcium Citrate (CITRACAL PO)   Oral   Take 630 mg by mouth daily.          . carbidopa-levodopa (SINEMET IR) 25-100 MG per tablet   Oral   Take 2 tablets by mouth 3 (three) times daily.         . carboxymethylcellulose (REFRESH PLUS) 0.5 % SOLN   Both Eyes   Place 1 drop into both eyes every 2 (two) hours as needed (dry eyes).         . clonazePAM (KLONOPIN) 1 MG tablet   Oral   Take 1 mg by mouth at bedtime.          Marland Kitchen Fesoterodine Fumarate (TOVIAZ PO)   Oral   Take 4 mg by mouth every evening.          Marland Kitchen HYDROcodone-acetaminophen (NORCO) 10-325 MG per tablet   Oral  Take 1 tablet by mouth every 6 (six) hours as needed.         Marland Kitchen losartan (COZAAR) 50 MG tablet   Oral   Take 100 mg by mouth daily.          . mirtazapine (REMERON) 15 MG tablet   Oral   Take 15 mg by mouth at bedtime.         . Multiple Vitamins-Minerals (CENTRUM SILVER PO)   Oral   Take 1 tablet by mouth daily.         . polyethylene glycol (MIRALAX / GLYCOLAX) packet   Oral   Take 17 g by mouth daily as needed (constipation).          Marland Kitchen zolpidem (AMBIEN) 10 MG tablet   Oral   Take 1 tablet by mouth daily.          BP 113/66  Pulse 99  Temp(Src) 97.7 F (36.5 C) (Oral)  Resp 20  SpO2 98% Physical Exam  Nursing note and vitals reviewed. Constitutional: She is oriented to person, place, and time. She appears well-developed and well-nourished. No distress.  HENT:  Head: Normocephalic and atraumatic.  Eyes: EOM are normal. Pupils are equal, round, and reactive to light.  Neck: Normal range of motion. Neck supple.  Cardiovascular: Normal rate and regular  rhythm.  Exam reveals no friction rub.   No murmur heard. Pulmonary/Chest: Effort normal and breath sounds normal. No respiratory distress. She has no wheezes. She has no rales.  Abdominal: Soft. She exhibits no distension. There is no tenderness. There is no rebound.  Musculoskeletal: Normal range of motion. She exhibits no edema.       Right knee: She exhibits ecchymosis and bony tenderness. She exhibits normal range of motion, no swelling, no effusion, no laceration, no erythema, no LCL laxity and no MCL laxity. Tenderness (proximal tibia) found.       Left hand: She exhibits tenderness (medial MCP of thumb). She exhibits normal range of motion. Normal sensation noted. Normal strength noted.  Neurological: She is alert and oriented to person, place, and time.  Skin: She is not diaphoretic.    ED Course  Procedures (including critical care time) Labs Review Labs Reviewed - No data to display Imaging Review Ct Head Wo Contrast  09/11/2013   CLINICAL DATA:  Headache after fall.  EXAM: CT HEAD WITHOUT CONTRAST  TECHNIQUE: Contiguous axial images were obtained from the base of the skull through the vertex without intravenous contrast.  COMPARISON:  None.  FINDINGS: Bony calvarium appears intact. Small right frontal scalp hematoma is noted. Mild diffuse cortical atrophy is noted. Chronic ischemic white matter disease is noted. No mass effect or midline shift is noted. Ventricular size is within normal limits. There is no evidence of mass lesion, hemorrhage or acute infarction.  IMPRESSION: Mild diffuse cortical atrophy. Small right frontal scalp hematoma. Mild chronic ischemic white matter disease. No acute intracranial abnormality seen.   Electronically Signed   By: Roque Lias M.D.   On: 09/11/2013 10:32   Dg Knee Complete 4 Views Right  09/11/2013   CLINICAL DATA:  Fall.  EXAM: RIGHT KNEE - COMPLETE 4+ VIEW  COMPARISON:  None.  FINDINGS: No fracture or dislocation.  Soft tissue swelling  prepatellar and infra patella region may indicate soft tissue injury.  IMPRESSION: No fracture or dislocation.  Soft tissue swelling prepatellar and infra patella region suggestive of soft tissue injury.   Electronically Signed   By: Brett Canales  Constance Goltz M.D.   On: 09/11/2013 10:15   Dg Finger Thumb Left  09/11/2013   CLINICAL DATA:  Pain status post fall  EXAM: LEFT THUMB 2+V  COMPARISON:  None.  FINDINGS: Three views of the right thumb reveal the bones to be adequately mineralized. There are mild degenerative changes of the 1st metacarpophalangeal joint and there is an avulsion from the radial aspect of the distal portion of the shaft of the 1st metacarpal. The phalanges appear intact. Minimal degenerative change of the interphalangeal joint is present. There is also mild degenerative change of the 1st carpometacarpal joint.  IMPRESSION: 1. There is an acute avulsion from the radial aspect of the distal portion of the shaft of the 1st metacarpal. 2. The phalanges appear intact. 3. There are mild degenerative changes of the 1st carpometacarpal, 1st metacarpophalangeal, and interphalangeal joint of the left thumb.   Electronically Signed   By: David  [place]   On: 09/11/2013 10:14    EKG Interpretation   None       MDM   1. Thumb fracture, left, closed, initial encounter   2. Fall, initial encounter    54F presents with a fall. Patient has Parkinson's and tripped while using her walker. Hit her head on the counter. No LOC, no vomiting. Not on any anti-coagulation. Hit her head in same spot twice over past 3 weeks requiring lac repair. AFVSS here. Bruising on R forehead. No neck pain. Lungs clear, no abdominal pain. Mild L medial thumb pain. No swelling. No laxity, normal ROM. Mild R anterior knee pain. No effusion, mild pain over proximal tibia. Full ROM, NVI distally. Will CT Head, xray thumb and R knee. Patient doesn't want any pain medications. Films reviewed by me and read by Radiology. CT Head  negative. Thumb with distal extra-articular avulsion fx. Thumb spica applied. Knee xray normal. Given hand f/u. Stable for discharge.  I have reviewed all labs and imaging and considered them in my medical decision making.   Dagmar Hait, MD 09/11/13 1057

## 2013-09-12 ENCOUNTER — Encounter: Payer: Medicare Other | Admitting: Occupational Therapy

## 2013-09-12 ENCOUNTER — Ambulatory Visit: Payer: Medicare Other | Admitting: Physical Therapy

## 2013-09-13 ENCOUNTER — Ambulatory Visit: Payer: Medicare Other | Admitting: Occupational Therapy

## 2013-09-13 ENCOUNTER — Ambulatory Visit: Payer: Medicare Other | Admitting: Physical Therapy

## 2013-09-17 ENCOUNTER — Ambulatory Visit: Payer: Medicare Other | Admitting: Physical Therapy

## 2013-09-17 ENCOUNTER — Ambulatory Visit: Payer: Medicare Other | Admitting: Occupational Therapy

## 2013-09-19 ENCOUNTER — Ambulatory Visit: Payer: Medicare Other | Admitting: Occupational Therapy

## 2013-09-19 ENCOUNTER — Ambulatory Visit: Payer: Medicare Other | Admitting: Physical Therapy

## 2013-09-24 ENCOUNTER — Ambulatory Visit: Payer: Medicare Other | Admitting: Physical Therapy

## 2013-09-24 ENCOUNTER — Encounter: Payer: Medicare Other | Admitting: Occupational Therapy

## 2013-09-26 ENCOUNTER — Ambulatory Visit: Payer: Medicare Other | Admitting: Occupational Therapy

## 2013-09-26 ENCOUNTER — Ambulatory Visit: Payer: Medicare Other | Admitting: Physical Therapy

## 2013-10-01 ENCOUNTER — Ambulatory Visit: Payer: Medicare Other | Attending: Neurology | Admitting: Physical Therapy

## 2013-10-01 DIAGNOSIS — R269 Unspecified abnormalities of gait and mobility: Secondary | ICD-10-CM | POA: Insufficient documentation

## 2013-10-01 DIAGNOSIS — G20A1 Parkinson's disease without dyskinesia, without mention of fluctuations: Secondary | ICD-10-CM | POA: Insufficient documentation

## 2013-10-01 DIAGNOSIS — G20 Parkinson's disease: Secondary | ICD-10-CM | POA: Insufficient documentation

## 2013-10-01 DIAGNOSIS — IMO0001 Reserved for inherently not codable concepts without codable children: Secondary | ICD-10-CM | POA: Insufficient documentation

## 2013-10-04 ENCOUNTER — Ambulatory Visit: Payer: Medicare Other | Admitting: Physical Therapy

## 2013-10-08 ENCOUNTER — Ambulatory Visit: Payer: Medicare Other | Admitting: Physical Therapy

## 2013-10-10 ENCOUNTER — Ambulatory Visit: Payer: Medicare Other | Admitting: Physical Therapy

## 2013-10-15 ENCOUNTER — Ambulatory Visit: Payer: Medicare Other | Admitting: Physical Therapy

## 2013-10-17 ENCOUNTER — Ambulatory Visit: Payer: Medicare Other | Admitting: Physical Therapy

## 2013-10-22 ENCOUNTER — Ambulatory Visit: Payer: No Typology Code available for payment source | Admitting: Physical Therapy

## 2013-10-23 ENCOUNTER — Ambulatory Visit: Payer: No Typology Code available for payment source | Admitting: Physical Therapy

## 2014-01-01 ENCOUNTER — Other Ambulatory Visit: Payer: Self-pay

## 2014-01-01 DIAGNOSIS — Z1231 Encounter for screening mammogram for malignant neoplasm of breast: Secondary | ICD-10-CM

## 2014-01-11 ENCOUNTER — Other Ambulatory Visit: Payer: Self-pay

## 2014-01-11 DIAGNOSIS — C50919 Malignant neoplasm of unspecified site of unspecified female breast: Secondary | ICD-10-CM

## 2014-01-14 ENCOUNTER — Telehealth: Payer: Self-pay | Admitting: *Deleted

## 2014-01-14 ENCOUNTER — Other Ambulatory Visit (HOSPITAL_BASED_OUTPATIENT_CLINIC_OR_DEPARTMENT_OTHER): Payer: Medicare Other

## 2014-01-14 ENCOUNTER — Ambulatory Visit (HOSPITAL_BASED_OUTPATIENT_CLINIC_OR_DEPARTMENT_OTHER): Payer: Medicare Other | Admitting: Oncology

## 2014-01-14 ENCOUNTER — Encounter: Payer: Self-pay | Admitting: Oncology

## 2014-01-14 VITALS — BP 157/88 | HR 111 | Temp 98.1°F | Resp 18 | Ht 61.0 in | Wt 82.1 lb

## 2014-01-14 DIAGNOSIS — Z853 Personal history of malignant neoplasm of breast: Secondary | ICD-10-CM

## 2014-01-14 DIAGNOSIS — G20 Parkinson's disease: Secondary | ICD-10-CM

## 2014-01-14 DIAGNOSIS — C50919 Malignant neoplasm of unspecified site of unspecified female breast: Secondary | ICD-10-CM

## 2014-01-14 LAB — CBC WITH DIFFERENTIAL/PLATELET
BASO%: 1 % (ref 0.0–2.0)
Basophils Absolute: 0.1 10*3/uL (ref 0.0–0.1)
EOS%: 0.3 % (ref 0.0–7.0)
Eosinophils Absolute: 0 10*3/uL (ref 0.0–0.5)
HEMATOCRIT: 39.1 % (ref 34.8–46.6)
HEMOGLOBIN: 12.7 g/dL (ref 11.6–15.9)
LYMPH#: 1.8 10*3/uL (ref 0.9–3.3)
LYMPH%: 25.1 % (ref 14.0–49.7)
MCH: 28.9 pg (ref 25.1–34.0)
MCHC: 32.4 g/dL (ref 31.5–36.0)
MCV: 89.4 fL (ref 79.5–101.0)
MONO#: 0.6 10*3/uL (ref 0.1–0.9)
MONO%: 9.1 % (ref 0.0–14.0)
NEUT#: 4.5 10*3/uL (ref 1.5–6.5)
NEUT%: 64.5 % (ref 38.4–76.8)
Platelets: 313 10*3/uL (ref 145–400)
RBC: 4.38 10*6/uL (ref 3.70–5.45)
RDW: 13.5 % (ref 11.2–14.5)
WBC: 7 10*3/uL (ref 3.9–10.3)

## 2014-01-14 LAB — COMPREHENSIVE METABOLIC PANEL (CC13)
ALBUMIN: 4.4 g/dL (ref 3.5–5.0)
ALT: 10 U/L (ref 0–55)
ANION GAP: 12 meq/L — AB (ref 3–11)
AST: 28 U/L (ref 5–34)
Alkaline Phosphatase: 78 U/L (ref 40–150)
BUN: 18.4 mg/dL (ref 7.0–26.0)
CALCIUM: 9.8 mg/dL (ref 8.4–10.4)
CHLORIDE: 100 meq/L (ref 98–109)
CO2: 28 meq/L (ref 22–29)
Creatinine: 0.9 mg/dL (ref 0.6–1.1)
Glucose: 98 mg/dl (ref 70–140)
POTASSIUM: 4.3 meq/L (ref 3.5–5.1)
Sodium: 139 mEq/L (ref 136–145)
Total Bilirubin: 0.57 mg/dL (ref 0.20–1.20)
Total Protein: 7.3 g/dL (ref 6.4–8.3)

## 2014-01-14 NOTE — Progress Notes (Signed)
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 1998 underwent a screening mammogram that showed calcifications and eventually she had a lumpectomy that showed intraductal carcinoma with microscopic invasive ductal carcinoma grade 1 ER positive PR positive in the right lower inner quadrant. She was staged as a stage IA. She had node-negative disease. She went on to receive adjuvant radiation therapy to the right breast.. She then received 5 years of tamoxifen adjuvantly. Patient has had multiple breast biopsies in 06/02/2009 she had a left biopsy with dense nodule. Ductal and perilobular fibrosis an intraductal microcalcifications. She had a lumpectomy. 06/15/2007 patient again had left and right breast biopsies that showed fibrocystic changes and periductal and perilobular fibrosis   CURRENT THERAPY:observation  INTERVAL HISTORY: Miranda [name] 68 y.o. female returns for followup visit today. She is quite weak. She tells me that her diagnosis of Parkinson's disease has changed. She has been trying to maintain her weight. She is followed by a PCP quite closely. She has not had any local evidence of recurrent disease to her breasts. She denies any aches and pains. She has no fevers chills or night sweats no hot flashes.  MEDICAL HISTORY: Past Medical History  Diagnosis Date  . Breast cancer 1998    lumpectomy  . Parkinson's disease 2012  . Depression   . Hypertension   . Progressive supranuclear palsy     ALLERGIES:  is allergic to codeine; demerol; and valium.  MEDICATIONS:  Current Outpatient  Prescriptions  Medication Sig Dispense Refill  . alendronate (FOSAMAX) 70 MG tablet Take 70 mg by mouth every 7 (seven) days. Take with a full glass of water on an empty stomach.      Marland Kitchen buPROPion (WELLBUTRIN XL) 300 MG 24 hr tablet Take 300 mg by mouth daily.      . Calcium Citrate (CITRACAL PO) Take 630 mg by mouth daily.       . carbidopa-levodopa (SINEMET IR) 25-100 MG per tablet Take 2 tablets by mouth 3 (three) times daily.      . carboxymethylcellulose (REFRESH PLUS) 0.5 % SOLN Place 1 drop into both eyes every 2 (two) hours as needed (dry eyes).      . Fesoterodine Fumarate (TOVIAZ PO) Take 4 mg by mouth every evening.       Marland Kitchen HYDROcodone-acetaminophen (NORCO) 10-325 MG per tablet Take 1 tablet by mouth every 6 (six) hours as needed.      Marland Kitchen losartan (COZAAR) 50 MG tablet Take 100 mg by mouth daily.       . mirtazapine (REMERON) 15 MG tablet Take 15 mg by mouth at bedtime.      . Multiple Vitamins-Minerals (CENTRUM SILVER PO) Take 1 tablet by mouth daily.      Marland Kitchen PARoxetine (PAXIL) 20 MG tablet       . polyethylene glycol (MIRALAX / GLYCOLAX) packet Take 17 g by mouth daily as needed (constipation).       Marland Kitchen zolpidem (AMBIEN) 10 MG tablet Take 1 tablet by mouth daily.      . clonazePAM (KLONOPIN) 1 MG tablet Take 1 mg by mouth at bedtime.  No current facility-administered medications for this visit.    SURGICAL HISTORY:  Past Surgical History  Procedure Laterality Date  . Lumpectomy right Right 1998    stage IA, DCIS with micrinvasion, ER+/PR+, , right breast lower inner quadrant  . Cesarean section  1975  . Anterior release vertebral body w/ posterior fusion  2010    L1-S1  . Spinal fusion      REVIEW OF SYSTEMS:  Pertinent items are noted in HPI.     PHYSICAL EXAMINATION: Blood pressure 157/88, pulse 111, temperature 98.1 F (36.7 C), temperature source Oral, resp. rate 18, height 5\' 1"  (1.549 m), weight 82 lb 1.6 oz (37.24 kg). Body mass index is 15.52  kg/(m^2). ECOG PERFORMANCE STATUS: 2 - Symptomatic, <50% confined to bed  Thin female in no acute distress HEENT exam: EOMI PERRLA sclerae anicteric no conjunctival pallor oral mucosa is moist neck supple no palpable cervical supraclavicular or axillary adenopathy Lungs: Clear to auscultation and percussion Cardiovascular: Regular rate rhythm no murmurs gallops or rubs Abdomen: Soft nontender nondistended bowel sounds are present no hepatosplenomegaly or palpable masses Extremities: No edema clubbing or cyanosis, pedal pulses are present Neuro: Alert oriented  grossly normal, has some difficulty walking Skin: Warm and moist, good capillary filling, no rashes. Breasts: breasts appear normal, no suspicious masses, no skin or nipple changes or axillary nodes.     LABORATORY DATA: Lab Results  Component Value Date   WBC 7.0 01/14/2014   HGB 12.7 01/14/2014   HCT 39.1 01/14/2014   MCV 89.4 01/14/2014   PLT 313 01/14/2014      Chemistry      Component Value Date/Time   NA 140 01/08/2013 1458   K 3.9 01/08/2013 1458   CL 100 01/08/2013 1458   CO2 30* 01/08/2013 1458   BUN 18.7 01/08/2013 1458   CREATININE 0.8 01/08/2013 1458      Component Value Date/Time   CALCIUM 9.8 01/08/2013 1458   ALKPHOS 84 01/08/2013 1458   AST 20 01/08/2013 1458   ALT 19 01/08/2013 1458   BILITOT 0.44 01/08/2013 1458       RADIOGRAPHIC STUDIES:  No results found.  ASSESSMENT/PLAN: 68 year old female with   1. history of T1 microscopic N0 M0 ductal carcinoma in situ with microscopic invasive disease. Tumor was ER positive PR positive in the right lower inner quadrant. Patient is status post lumpectomy followed by radiation therapy and 5 years of tamoxifen. Overall patient has done well. She has had several breast biopsies performed including 2008 and 2010 but all of these were negative for malignancies. Clinically patient has no evidence of recurrent disease   2.Health Maintenance: Last mammogram was April  2014. She is due for another one.  3. Neurologic disease: Patient is followed by neurology at [facility]  4. We will continue to see the patient on a yearly basis.   All questions were answered. The patient knows to call the clinic with any problems, questions or concerns. We can certainly see the patient much sooner if necessary.  I spent 15 minutes counseling the patient face to face. The total time spent in the appointment was 25 minutes.    Marcy Panning, MD Medical/Oncology [facility] [phone] (beeper) [phone] (Office)  01/14/2014, 2:51 PM

## 2014-01-14 NOTE — Telephone Encounter (Signed)
appts made and printed...td 

## 2014-02-07 ENCOUNTER — Ambulatory Visit: Payer: Medicare Other | Admitting: Physical Therapy

## 2014-02-07 ENCOUNTER — Ambulatory Visit: Payer: No Typology Code available for payment source | Admitting: Occupational Therapy

## 2014-02-08 ENCOUNTER — Ambulatory Visit: Payer: Medicare Other | Attending: Neurology

## 2014-02-08 DIAGNOSIS — R1310 Dysphagia, unspecified: Secondary | ICD-10-CM | POA: Insufficient documentation

## 2014-02-08 DIAGNOSIS — R269 Unspecified abnormalities of gait and mobility: Secondary | ICD-10-CM | POA: Insufficient documentation

## 2014-02-08 DIAGNOSIS — R471 Dysarthria and anarthria: Secondary | ICD-10-CM | POA: Insufficient documentation

## 2014-02-08 DIAGNOSIS — G238 Other specified degenerative diseases of basal ganglia: Secondary | ICD-10-CM | POA: Insufficient documentation

## 2014-02-08 DIAGNOSIS — G20 Parkinson's disease: Secondary | ICD-10-CM | POA: Insufficient documentation

## 2014-02-08 DIAGNOSIS — IMO0001 Reserved for inherently not codable concepts without codable children: Secondary | ICD-10-CM | POA: Insufficient documentation

## 2014-02-08 DIAGNOSIS — G20A1 Parkinson's disease without dyskinesia, without mention of fluctuations: Secondary | ICD-10-CM | POA: Insufficient documentation

## 2014-02-11 ENCOUNTER — Ambulatory Visit: Payer: Medicare Other

## 2014-02-11 DIAGNOSIS — IMO0001 Reserved for inherently not codable concepts without codable children: Secondary | ICD-10-CM | POA: Diagnosis not present

## 2014-02-12 ENCOUNTER — Ambulatory Visit: Payer: No Typology Code available for payment source

## 2014-02-14 ENCOUNTER — Ambulatory Visit: Payer: Medicare Other | Admitting: Speech Pathology

## 2014-02-14 DIAGNOSIS — IMO0001 Reserved for inherently not codable concepts without codable children: Secondary | ICD-10-CM | POA: Diagnosis not present

## 2014-02-19 ENCOUNTER — Ambulatory Visit: Payer: Medicare Other | Admitting: Speech Pathology

## 2014-02-19 DIAGNOSIS — IMO0001 Reserved for inherently not codable concepts without codable children: Secondary | ICD-10-CM | POA: Diagnosis not present

## 2014-02-20 ENCOUNTER — Ambulatory Visit
Admission: RE | Admit: 2014-02-20 | Discharge: 2014-02-20 | Disposition: A | Discharge status: Home / Self Care | Payer: Medicare Other | Source: Ambulatory Visit

## 2014-02-20 DIAGNOSIS — Z1231 Encounter for screening mammogram for malignant neoplasm of breast: Secondary | ICD-10-CM

## 2014-02-21 ENCOUNTER — Ambulatory Visit: Payer: Medicare Other | Admitting: Physical Therapy

## 2014-02-21 ENCOUNTER — Ambulatory Visit: Payer: Medicare Other | Admitting: Occupational Therapy

## 2014-02-21 ENCOUNTER — Ambulatory Visit: Payer: Medicare Other | Admitting: Speech Pathology

## 2014-02-21 DIAGNOSIS — IMO0001 Reserved for inherently not codable concepts without codable children: Secondary | ICD-10-CM | POA: Diagnosis not present

## 2014-02-26 ENCOUNTER — Ambulatory Visit: Payer: Medicare Other

## 2014-02-26 DIAGNOSIS — IMO0001 Reserved for inherently not codable concepts without codable children: Secondary | ICD-10-CM | POA: Diagnosis not present

## 2014-03-05 ENCOUNTER — Ambulatory Visit: Payer: Medicare Other | Attending: Neurology

## 2014-03-05 DIAGNOSIS — IMO0001 Reserved for inherently not codable concepts without codable children: Secondary | ICD-10-CM | POA: Diagnosis not present

## 2014-03-05 DIAGNOSIS — R1310 Dysphagia, unspecified: Secondary | ICD-10-CM | POA: Diagnosis not present

## 2014-03-05 DIAGNOSIS — G20A1 Parkinson's disease without dyskinesia, without mention of fluctuations: Secondary | ICD-10-CM | POA: Insufficient documentation

## 2014-03-05 DIAGNOSIS — R471 Dysarthria and anarthria: Secondary | ICD-10-CM | POA: Insufficient documentation

## 2014-03-05 DIAGNOSIS — G20 Parkinson's disease: Secondary | ICD-10-CM | POA: Diagnosis not present

## 2014-03-05 DIAGNOSIS — R269 Unspecified abnormalities of gait and mobility: Secondary | ICD-10-CM | POA: Insufficient documentation

## 2014-03-07 ENCOUNTER — Ambulatory Visit: Payer: Medicare Other

## 2014-03-07 DIAGNOSIS — IMO0001 Reserved for inherently not codable concepts without codable children: Secondary | ICD-10-CM | POA: Diagnosis not present

## 2014-03-12 ENCOUNTER — Ambulatory Visit: Payer: Medicare Other

## 2014-03-12 DIAGNOSIS — IMO0001 Reserved for inherently not codable concepts without codable children: Secondary | ICD-10-CM | POA: Diagnosis not present

## 2014-03-14 ENCOUNTER — Ambulatory Visit: Payer: Medicare Other | Admitting: Speech Pathology

## 2014-03-14 DIAGNOSIS — IMO0001 Reserved for inherently not codable concepts without codable children: Secondary | ICD-10-CM | POA: Diagnosis not present

## 2014-03-19 ENCOUNTER — Ambulatory Visit: Payer: Medicare Other | Admitting: Speech Pathology

## 2014-03-19 DIAGNOSIS — IMO0001 Reserved for inherently not codable concepts without codable children: Secondary | ICD-10-CM | POA: Diagnosis not present

## 2014-03-21 ENCOUNTER — Ambulatory Visit: Payer: Medicare Other

## 2014-03-21 DIAGNOSIS — IMO0001 Reserved for inherently not codable concepts without codable children: Secondary | ICD-10-CM | POA: Diagnosis not present

## 2014-03-24 ENCOUNTER — Encounter (HOSPITAL_COMMUNITY): Payer: Self-pay | Admitting: Emergency Medicine

## 2014-03-24 ENCOUNTER — Emergency Department (HOSPITAL_COMMUNITY)
Admission: EM | Admit: 2014-03-24 | Discharge: 2014-03-25 | Disposition: A | Discharge status: Home / Self Care | Payer: Medicare Other | Attending: Emergency Medicine | Admitting: Emergency Medicine

## 2014-03-24 DIAGNOSIS — R5383 Other fatigue: Secondary | ICD-10-CM

## 2014-03-24 DIAGNOSIS — Z853 Personal history of malignant neoplasm of breast: Secondary | ICD-10-CM | POA: Insufficient documentation

## 2014-03-24 DIAGNOSIS — I10 Essential (primary) hypertension: Secondary | ICD-10-CM | POA: Insufficient documentation

## 2014-03-24 DIAGNOSIS — S79929A Unspecified injury of unspecified thigh, initial encounter: Secondary | ICD-10-CM

## 2014-03-24 DIAGNOSIS — S0083XA Contusion of other part of head, initial encounter: Secondary | ICD-10-CM | POA: Insufficient documentation

## 2014-03-24 DIAGNOSIS — F3289 Other specified depressive episodes: Secondary | ICD-10-CM | POA: Insufficient documentation

## 2014-03-24 DIAGNOSIS — S79919A Unspecified injury of unspecified hip, initial encounter: Secondary | ICD-10-CM | POA: Insufficient documentation

## 2014-03-24 DIAGNOSIS — S0990XA Unspecified injury of head, initial encounter: Secondary | ICD-10-CM

## 2014-03-24 DIAGNOSIS — G20A1 Parkinson's disease without dyskinesia, without mention of fluctuations: Secondary | ICD-10-CM | POA: Insufficient documentation

## 2014-03-24 DIAGNOSIS — S0003XA Contusion of scalp, initial encounter: Secondary | ICD-10-CM | POA: Insufficient documentation

## 2014-03-24 DIAGNOSIS — Y929 Unspecified place or not applicable: Secondary | ICD-10-CM | POA: Insufficient documentation

## 2014-03-24 DIAGNOSIS — R5381 Other malaise: Secondary | ICD-10-CM | POA: Insufficient documentation

## 2014-03-24 DIAGNOSIS — Z79899 Other long term (current) drug therapy: Secondary | ICD-10-CM | POA: Insufficient documentation

## 2014-03-24 DIAGNOSIS — W1809XA Striking against other object with subsequent fall, initial encounter: Secondary | ICD-10-CM | POA: Insufficient documentation

## 2014-03-24 DIAGNOSIS — G20 Parkinson's disease: Secondary | ICD-10-CM | POA: Insufficient documentation

## 2014-03-24 DIAGNOSIS — F329 Major depressive disorder, single episode, unspecified: Secondary | ICD-10-CM | POA: Insufficient documentation

## 2014-03-24 DIAGNOSIS — Y9301 Activity, walking, marching and hiking: Secondary | ICD-10-CM | POA: Insufficient documentation

## 2014-03-24 DIAGNOSIS — S1093XA Contusion of unspecified part of neck, initial encounter: Secondary | ICD-10-CM

## 2014-03-24 DIAGNOSIS — M25551 Pain in right hip: Secondary | ICD-10-CM

## 2014-03-24 HISTORY — DX: Repeated falls: R29.6

## 2014-03-24 NOTE — ED Notes (Signed)
Pt A+Ox4, reports tripped and fell into a bedside table.  Pt reports hitting R side of face and falling onto R side.  Pt c/o 8/10 pain to R side of face, R hip and Lknee.  Abrasions noted to L hand and L knee with bleeding controlled.  Pt reports hx Parkinsons "and i'm unsteady a lot".  Pt denies LOC.  Pt reports ambulatory after event.  Speaking full/clear sentences, rr even/un-lab.  MAEI.  Pt denies n/t to extremities.  NAD.

## 2014-03-25 ENCOUNTER — Emergency Department (HOSPITAL_COMMUNITY): Payer: Medicare Other

## 2014-03-25 MED ORDER — ACETAMINOPHEN 325 MG PO TABS
650.0000 mg | ORAL_TABLET | Freq: Once | ORAL | Status: AC
  Administered 2014-03-25: 650 mg via ORAL
  Filled 2014-03-25: qty 2

## 2014-03-25 NOTE — Discharge Instructions (Signed)
If you were given medicines take as directed.  If you are on coumadin or contraceptives realize their levels and effectiveness is altered by many different medicines.  If you have any reaction (rash, tongues swelling, other) to the medicines stop taking and see a physician.   Please follow up as directed and return to the ER or see a physician for new or worsening symptoms.  Thank you. Filed Vitals:   03/24/14 2252  BP: 152/76  Pulse: 101  Temp: 98.5 F (36.9 C)  TempSrc: Oral  Resp: 16  Height: 5\' 1"  (1.549 m)  Weight: 82 lb (37.195 kg)  SpO2: 99%

## 2014-03-25 NOTE — ED Notes (Signed)
Pt was able to ambulate with one assist

## 2014-03-25 NOTE — ED Provider Notes (Signed)
CSN: [id number]     Arrival date & time 03/24/14  2250 History   First MD Initiated Contact with Patient 03/24/14 2337     Chief Complaint  Patient presents with  . Fall     (Consider location/radiation/quality/duration/timing/severity/associated sxs/prior Treatment) HPI Comments: 68 year old female with Parkinson's disease, supranuclear palsy, multiple falls presents after a witnessed fall. Patient was walking with assistance by her husband and lost footing and fell forward hitting the right side of her cheek on a wooden table in her right hip flank. No loss of consciousness or vomiting since. No new neuro symptoms or signs. Patient falls frequently usually related to timing of her medication or footing. Pain with palpation.  Patient is a 68 y.o. female presenting with fall. The history is provided by the patient, a relative and the spouse.  Fall Pertinent negatives include no chest pain, no abdominal pain, no headaches and no shortness of breath.    Past Medical History  Diagnosis Date  . Breast cancer 1998    lumpectomy  . Parkinson's disease 2012  . Depression   . Hypertension   . Progressive supranuclear palsy   . Multiple falls    Past Surgical History  Procedure Laterality Date  . Lumpectomy right Right 1998    stage IA, DCIS with micrinvasion, ER+/PR+, , right breast lower inner quadrant  . Cesarean section  1975  . Anterior release vertebral body w/ posterior fusion  2010    L1-S1  . Spinal fusion     Family History  Problem Relation Age of Onset  . Stroke Father   . Cancer Sister   . Cancer Son 3    leiomyosarcoma   History  Substance Use Topics  . Smoking status: Never Smoker   . Smokeless tobacco: Never Used  . Alcohol Use: 0.0 oz/week    0 Glasses of wine per week   OB History   Grav Para Term Preterm Abortions TAB SAB Ect Mult Living                 Review of Systems  Constitutional: Positive for fatigue. Negative for fever and chills.  HENT:  Negative for congestion.   Eyes: Negative for visual disturbance.  Respiratory: Negative for shortness of breath.   Cardiovascular: Negative for chest pain.  Gastrointestinal: Negative for vomiting and abdominal pain.  Genitourinary: Negative for dysuria and flank pain.  Musculoskeletal: Positive for arthralgias and gait problem (patient uses walker or assistance). Negative for back pain, neck pain and neck stiffness.  Skin: Positive for wound.  Neurological: Negative for light-headedness and headaches.      Allergies  Codeine; Demerol; and Valium  Home Medications   Prior to Admission medications   Medication Sig Start Date End Date Taking? Authorizing Provider  alendronate (FOSAMAX) 70 MG tablet Take 70 mg by mouth every 7 (seven) days. Take with a full glass of water on an empty stomach.   Yes Historical Provider, MD  buPROPion (WELLBUTRIN XL) 300 MG 24 hr tablet Take 300 mg by mouth daily.   Yes Historical Provider, MD  Calcium Citrate (CITRACAL PO) Take 630 mg by mouth daily.    Yes Historical Provider, MD  carbidopa-levodopa (SINEMET IR) 25-100 MG per tablet Take 2 tablets by mouth 3 (three) times daily. Takes 1/2 doses     And whole tablet for 2nd and third doses   Yes Historical Provider, MD  carboxymethylcellulose (REFRESH PLUS) 0.5 % SOLN Place 1 drop into both eyes every 2 (two)  hours as needed (dry eyes).   Yes Historical Provider, MD  Fesoterodine Fumarate (TOVIAZ PO) Take 4 mg by mouth every evening.    Yes Historical Provider, MD  HYDROcodone-acetaminophen (NORCO) 10-325 MG per tablet Take 1 tablet by mouth every 6 (six) hours as needed. 07/12/13  Yes Historical Provider, MD  mirtazapine (REMERON) 15 MG tablet Take 15 mg by mouth at bedtime.   Yes Historical Provider, MD  Multiple Vitamins-Minerals (CENTRUM SILVER PO) Take 1 tablet by mouth daily.   Yes Historical Provider, MD  PARoxetine (PAXIL) 20 MG tablet  01/07/14  Yes Historical Provider, MD  polyethylene glycol  (MIRALAX / GLYCOLAX) packet Take 17 g by mouth daily as needed (constipation).    Yes Historical Provider, MD  valsartan (DIOVAN) 160 MG tablet Take 160 mg by mouth daily.   Yes Historical Provider, MD  zolpidem (AMBIEN) 10 MG tablet Take 1 tablet by mouth daily. 08/28/13  Yes Historical Provider, MD   BP 152/76  Pulse 101  Temp(Src) 98.5 F (36.9 C) (Oral)  Resp 16  Ht 5\' 1"  (1.549 m)  Wt 82 lb (37.195 kg)  BMI 15.50 kg/m2  SpO2 99% Physical Exam  Nursing note and vitals reviewed. Constitutional: She is oriented to person, place, and time. She appears well-developed and well-nourished.  HENT:  Head: Normocephalic and atraumatic.  Eyes: Conjunctivae are normal. Right eye exhibits no discharge. Left eye exhibits no discharge.  Neck: Normal range of motion. Neck supple. No tracheal deviation present.  Cardiovascular: Normal rate and regular rhythm.   Pulmonary/Chest: Effort normal and breath sounds normal.  Abdominal: Soft. She exhibits no distension. There is no tenderness. There is no guarding.  Musculoskeletal: She exhibits tenderness. She exhibits no edema.  Patient has mild tenderness with external rotation of the right hip. Patient mild tenderness to the right maxilla region with mild swelling and ecchymosis.  Neurological: She is alert and oriented to person, place, and time. GCS eye subscore is 4. GCS verbal subscore is 5. GCS motor subscore is 6.  Patient has flat affect and Parkinson's symptoms and signs. Patient moves all extremities equal with equal strength and gross sensation. Horizontal extraocular muscle function intact in vertical difficulty which is chronic. Pupils are equal bilateral  Skin: Skin is warm. No rash noted.  Superficial abrasion to right cheek, left knee.    ED Course  Procedures (including critical care time) Labs Review Labs Reviewed - No data to display  Imaging Review Dg Hip Complete Right  03/25/2014   CLINICAL DATA:  Status post fall; right  hip pain.  EXAM: RIGHT HIP - COMPLETE 2+ VIEW  COMPARISON:  None.  FINDINGS: There is no evidence of fracture or dislocation. Both femoral heads are seated normally within their respective acetabula. The proximal right femur appears intact. No significant degenerative change is appreciated. The sacroiliac joints are unremarkable in appearance. Lumbosacral spinal fusion hardware is partially imaged, and appears grossly intact.  The visualized bowel gas pattern is grossly unremarkable in appearance. Soft tissue swelling is suggested overlying the lateral right flank.  IMPRESSION: 1. No evidence of fracture or dislocation. 2. Soft tissue swelling suggested overlying the lateral right flank.   Electronically Signed   By: Garald Balding M.D.   On: 03/25/2014 02:11   Ct Head Wo Contrast  03/25/2014   CLINICAL DATA:  Tripped and fell into bed side table striking right-side of head and face  EXAM: CT HEAD WITHOUT CONTRAST  TECHNIQUE: Contiguous axial images were obtained from the  base of the skull through the vertex without intravenous contrast.  COMPARISON:  09/11/2013  FINDINGS: Mild generalized atrophy.  Normal ventricular morphology.  No midline shift or mass effect.  Otherwise normal appearance of brain parenchyma.  No intracranial hemorrhage, mass lesion, or acute infarction.  Visualized paranasal sinuses clear.  Bones unremarkable.  Minimal RIGHT supraorbital scalp hematoma.  IMPRESSION: Mild generalized atrophy.  No acute intracranial abnormalities.   Electronically Signed   By: Lavonia Dana M.D.   On: 03/25/2014 01:51     EKG Interpretation None      MDM   Final diagnoses:  Acute head injury  Facial contusion  Right hip pain   Mechanical fall similar previous. Tylenol for pain. X-ray right hip reviewed no acute finding and CT head reviewed soft tissue swelling in no acute finding Plan for gait test in ED and likely followup outpatient. Patient did have baseline ED and patient feels comfortable  on recheck. Family with patient in followup discussed Results and differential diagnosis were discussed with the patient/parent/guardian. Close follow up outpatient was discussed, comfortable with the plan.   Filed Vitals:   03/24/14 2252  BP: 152/76  Pulse: 101  Temp: 98.5 F (36.9 C)  TempSrc: Oral  Resp: 16  Height: 5\' 1"  (1.549 m)  Weight: 82 lb (37.195 kg)  SpO2: 99%     Mariea Clonts, MD 03/25/14 [id number]

## 2014-03-26 ENCOUNTER — Ambulatory Visit: Payer: Medicare Other | Admitting: Speech Pathology

## 2014-03-26 DIAGNOSIS — IMO0001 Reserved for inherently not codable concepts without codable children: Secondary | ICD-10-CM | POA: Diagnosis not present

## 2014-03-28 ENCOUNTER — Ambulatory Visit: Payer: Medicare Other

## 2014-03-28 DIAGNOSIS — IMO0001 Reserved for inherently not codable concepts without codable children: Secondary | ICD-10-CM | POA: Diagnosis not present

## 2014-04-04 ENCOUNTER — Ambulatory Visit: Payer: Medicare Other | Attending: Neurology

## 2014-04-04 DIAGNOSIS — G20 Parkinson's disease: Secondary | ICD-10-CM | POA: Insufficient documentation

## 2014-04-04 DIAGNOSIS — R471 Dysarthria and anarthria: Secondary | ICD-10-CM | POA: Insufficient documentation

## 2014-04-04 DIAGNOSIS — IMO0001 Reserved for inherently not codable concepts without codable children: Secondary | ICD-10-CM | POA: Insufficient documentation

## 2014-04-04 DIAGNOSIS — R269 Unspecified abnormalities of gait and mobility: Secondary | ICD-10-CM | POA: Diagnosis not present

## 2014-04-04 DIAGNOSIS — G20A1 Parkinson's disease without dyskinesia, without mention of fluctuations: Secondary | ICD-10-CM | POA: Insufficient documentation

## 2014-04-04 DIAGNOSIS — R1310 Dysphagia, unspecified: Secondary | ICD-10-CM | POA: Diagnosis not present

## 2014-05-30 ENCOUNTER — Ambulatory Visit: Payer: Medicare Other | Attending: Neurology | Admitting: Physical Therapy

## 2014-05-30 ENCOUNTER — Ambulatory Visit: Payer: Medicare Other | Admitting: Occupational Therapy

## 2014-05-30 ENCOUNTER — Ambulatory Visit: Payer: Medicare Other | Admitting: Speech Pathology

## 2014-05-30 DIAGNOSIS — R269 Unspecified abnormalities of gait and mobility: Secondary | ICD-10-CM | POA: Insufficient documentation

## 2014-05-30 DIAGNOSIS — R471 Dysarthria and anarthria: Secondary | ICD-10-CM | POA: Insufficient documentation

## 2014-05-30 DIAGNOSIS — G20 Parkinson's disease: Secondary | ICD-10-CM | POA: Insufficient documentation

## 2014-05-30 DIAGNOSIS — R1310 Dysphagia, unspecified: Secondary | ICD-10-CM | POA: Insufficient documentation

## 2014-05-30 DIAGNOSIS — G20A1 Parkinson's disease without dyskinesia, without mention of fluctuations: Secondary | ICD-10-CM | POA: Insufficient documentation

## 2014-05-30 DIAGNOSIS — IMO0001 Reserved for inherently not codable concepts without codable children: Secondary | ICD-10-CM | POA: Insufficient documentation

## 2014-06-28 ENCOUNTER — Ambulatory Visit: Payer: Medicare Other | Admitting: Occupational Therapy

## 2014-06-28 ENCOUNTER — Ambulatory Visit: Payer: Medicare Other | Attending: Neurology | Admitting: Physical Therapy

## 2014-06-28 DIAGNOSIS — IMO0001 Reserved for inherently not codable concepts without codable children: Secondary | ICD-10-CM | POA: Insufficient documentation

## 2014-06-28 DIAGNOSIS — G20A1 Parkinson's disease without dyskinesia, without mention of fluctuations: Secondary | ICD-10-CM | POA: Insufficient documentation

## 2014-06-28 DIAGNOSIS — G20 Parkinson's disease: Secondary | ICD-10-CM | POA: Diagnosis not present

## 2014-06-28 DIAGNOSIS — R1310 Dysphagia, unspecified: Secondary | ICD-10-CM | POA: Insufficient documentation

## 2014-06-28 DIAGNOSIS — R269 Unspecified abnormalities of gait and mobility: Secondary | ICD-10-CM | POA: Diagnosis not present

## 2014-06-28 DIAGNOSIS — R471 Dysarthria and anarthria: Secondary | ICD-10-CM | POA: Diagnosis not present

## 2014-07-02 ENCOUNTER — Ambulatory Visit: Payer: Medicare Other | Admitting: Physical Therapy

## 2014-07-03 ENCOUNTER — Ambulatory Visit: Payer: Medicare Other | Attending: Neurology | Admitting: Physical Therapy

## 2014-07-03 DIAGNOSIS — R471 Dysarthria and anarthria: Secondary | ICD-10-CM | POA: Diagnosis not present

## 2014-07-03 DIAGNOSIS — R269 Unspecified abnormalities of gait and mobility: Secondary | ICD-10-CM | POA: Diagnosis not present

## 2014-07-03 DIAGNOSIS — IMO0001 Reserved for inherently not codable concepts without codable children: Secondary | ICD-10-CM | POA: Diagnosis not present

## 2014-07-03 DIAGNOSIS — R1310 Dysphagia, unspecified: Secondary | ICD-10-CM | POA: Insufficient documentation

## 2014-07-03 DIAGNOSIS — G20 Parkinson's disease: Secondary | ICD-10-CM | POA: Insufficient documentation

## 2014-07-03 DIAGNOSIS — G20A1 Parkinson's disease without dyskinesia, without mention of fluctuations: Secondary | ICD-10-CM | POA: Insufficient documentation

## 2014-07-04 ENCOUNTER — Ambulatory Visit: Payer: Medicare Other | Admitting: Physical Therapy

## 2014-07-04 DIAGNOSIS — IMO0001 Reserved for inherently not codable concepts without codable children: Secondary | ICD-10-CM | POA: Diagnosis not present

## 2014-07-09 ENCOUNTER — Ambulatory Visit: Payer: Medicare Other | Admitting: Physical Therapy

## 2014-07-09 DIAGNOSIS — IMO0001 Reserved for inherently not codable concepts without codable children: Secondary | ICD-10-CM | POA: Diagnosis not present

## 2014-07-10 ENCOUNTER — Ambulatory Visit: Payer: Medicare Other | Admitting: Physical Therapy

## 2014-07-10 DIAGNOSIS — IMO0001 Reserved for inherently not codable concepts without codable children: Secondary | ICD-10-CM | POA: Diagnosis not present

## 2014-07-16 ENCOUNTER — Ambulatory Visit: Payer: Medicare Other | Admitting: Physical Therapy

## 2014-07-16 ENCOUNTER — Ambulatory Visit: Payer: Medicare Other | Admitting: Occupational Therapy

## 2014-07-16 DIAGNOSIS — IMO0001 Reserved for inherently not codable concepts without codable children: Secondary | ICD-10-CM | POA: Diagnosis not present

## 2014-07-17 ENCOUNTER — Encounter: Payer: Medicare Other | Admitting: Occupational Therapy

## 2014-07-23 ENCOUNTER — Ambulatory Visit: Payer: Medicare Other | Admitting: Physical Therapy

## 2014-07-23 ENCOUNTER — Ambulatory Visit: Payer: Medicare Other | Admitting: Occupational Therapy

## 2014-07-23 DIAGNOSIS — IMO0001 Reserved for inherently not codable concepts without codable children: Secondary | ICD-10-CM | POA: Diagnosis not present

## 2014-07-25 ENCOUNTER — Ambulatory Visit: Payer: Medicare Other | Admitting: Occupational Therapy

## 2014-07-25 DIAGNOSIS — IMO0001 Reserved for inherently not codable concepts without codable children: Secondary | ICD-10-CM | POA: Diagnosis not present

## 2014-07-30 ENCOUNTER — Ambulatory Visit: Payer: Medicare Other | Admitting: Occupational Therapy

## 2014-07-30 DIAGNOSIS — IMO0001 Reserved for inherently not codable concepts without codable children: Secondary | ICD-10-CM | POA: Diagnosis not present

## 2014-12-05 ENCOUNTER — Other Ambulatory Visit: Payer: Self-pay

## 2014-12-05 DIAGNOSIS — Z1231 Encounter for screening mammogram for malignant neoplasm of breast: Secondary | ICD-10-CM

## 2014-12-31 ENCOUNTER — Emergency Department (HOSPITAL_COMMUNITY): Payer: Medicare Other

## 2014-12-31 ENCOUNTER — Emergency Department (HOSPITAL_COMMUNITY)
Admission: EM | Admit: 2014-12-31 | Discharge: 2014-12-31 | Disposition: A | Discharge status: Home / Self Care | Payer: Medicare Other | Attending: Emergency Medicine | Admitting: Emergency Medicine

## 2014-12-31 ENCOUNTER — Encounter (HOSPITAL_COMMUNITY): Payer: Self-pay | Admitting: *Deleted

## 2014-12-31 DIAGNOSIS — Z9181 History of falling: Secondary | ICD-10-CM | POA: Diagnosis not present

## 2014-12-31 DIAGNOSIS — Y9389 Activity, other specified: Secondary | ICD-10-CM | POA: Insufficient documentation

## 2014-12-31 DIAGNOSIS — I10 Essential (primary) hypertension: Secondary | ICD-10-CM | POA: Diagnosis not present

## 2014-12-31 DIAGNOSIS — Z79899 Other long term (current) drug therapy: Secondary | ICD-10-CM | POA: Diagnosis not present

## 2014-12-31 DIAGNOSIS — Z853 Personal history of malignant neoplasm of breast: Secondary | ICD-10-CM | POA: Diagnosis not present

## 2014-12-31 DIAGNOSIS — S2231XA Fracture of one rib, right side, initial encounter for closed fracture: Secondary | ICD-10-CM

## 2014-12-31 DIAGNOSIS — Y998 Other external cause status: Secondary | ICD-10-CM | POA: Insufficient documentation

## 2014-12-31 DIAGNOSIS — W01198A Fall on same level from slipping, tripping and stumbling with subsequent striking against other object, initial encounter: Secondary | ICD-10-CM | POA: Diagnosis not present

## 2014-12-31 DIAGNOSIS — S2241XA Multiple fractures of ribs, right side, initial encounter for closed fracture: Secondary | ICD-10-CM | POA: Diagnosis not present

## 2014-12-31 DIAGNOSIS — F329 Major depressive disorder, single episode, unspecified: Secondary | ICD-10-CM | POA: Diagnosis not present

## 2014-12-31 DIAGNOSIS — Y92091 Bathroom in other non-institutional residence as the place of occurrence of the external cause: Secondary | ICD-10-CM | POA: Diagnosis not present

## 2014-12-31 DIAGNOSIS — Z7983 Long term (current) use of bisphosphonates: Secondary | ICD-10-CM | POA: Diagnosis not present

## 2014-12-31 DIAGNOSIS — G20 Parkinson's disease: Secondary | ICD-10-CM | POA: Diagnosis not present

## 2014-12-31 DIAGNOSIS — S299XXA Unspecified injury of thorax, initial encounter: Secondary | ICD-10-CM | POA: Diagnosis present

## 2014-12-31 MED ORDER — MORPHINE SULFATE 4 MG/ML IJ SOLN
4.0000 mg | Freq: Once | INTRAMUSCULAR | Status: AC
  Administered 2014-12-31: 4 mg via INTRAMUSCULAR
  Filled 2014-12-31: qty 1

## 2014-12-31 MED ORDER — ONDANSETRON 4 MG PO TBDP
4.0000 mg | ORAL_TABLET | Freq: Once | ORAL | Status: AC
  Administered 2014-12-31: 4 mg via ORAL
  Filled 2014-12-31: qty 1

## 2014-12-31 MED ORDER — OXYCODONE-ACETAMINOPHEN 5-325 MG PO TABS: 1.0000 | ORAL_TABLET | Freq: Four times a day (QID) | ORAL | Status: DC | PRN

## 2014-12-31 MED ORDER — DOCUSATE SODIUM 100 MG PO CAPS: 100.0000 mg | ORAL_CAPSULE | Freq: Two times a day (BID) | ORAL | Status: DC

## 2014-12-31 NOTE — ED Notes (Signed)
IS instructions given and patient was able to demonstrate proper use of the IS.

## 2014-12-31 NOTE — ED Notes (Signed)
MD at bedside. 

## 2014-12-31 NOTE — ED Notes (Signed)
Pt reports fell in bathroom this am, hit right side of rib cage on tub. Pain to rib area.

## 2014-12-31 NOTE — ED Provider Notes (Signed)
TIME SEEN: 8:23 AM  CHIEF COMPLAINT: Fall, right rib pain  HPI: Pt is a 69 y.o. 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 Did not hit her head or pass out. Is not on anticoagulation. Denies any other injury. No short of breath. No numbness, tingling or focal weakness.  ROS: See HPI Constitutional: no fever  Eyes: no drainage  ENT: no runny nose   Cardiovascular: Right-sided chest pain  Resp: no SOB  GI: no vomiting GU: no dysuria Integumentary: no rash  Allergy: no hives  Musculoskeletal: no leg swelling  Neurological: no slurred speech ROS otherwise negative  PAST MEDICAL HISTORY/PAST SURGICAL HISTORY:  Past Medical History  Diagnosis Date  . Breast cancer 1998    lumpectomy  . Parkinson's disease 2012  . Depression   . Hypertension   . Progressive supranuclear palsy   . Multiple falls     MEDICATIONS:  Prior to Admission medications   Medication Sig Start Date End Date Taking? Authorizing Provider  alendronate (FOSAMAX) 70 MG tablet Take 70 mg by mouth every 7 (seven) days. Take with a full glass of water on an empty stomach.    Historical Provider, MD  buPROPion (WELLBUTRIN XL) 300 MG 24 hr tablet Take 300 mg by mouth daily.    Historical Provider, MD  Calcium Citrate (CITRACAL PO) Take 630 mg by mouth daily.     Historical Provider, MD  carbidopa-levodopa (SINEMET IR) 25-100 MG per tablet Take 2 tablets by mouth 3 (three) times daily. Takes 1/2 doses     And whole tablet for 2nd and third doses    Historical Provider, MD  carboxymethylcellulose (REFRESH PLUS) 0.5 % SOLN Place 1 drop into both eyes every 2 (two) hours as needed (dry eyes).    Historical Provider, MD  Fesoterodine  Fumarate (TOVIAZ PO) Take 4 mg by mouth every evening.     Historical Provider, MD  HYDROcodone-acetaminophen (NORCO) 10-325 MG per tablet Take 1 tablet by mouth every 6 (six) hours as needed. 07/12/13   Historical Provider, MD  mirtazapine (REMERON) 15 MG tablet Take 15 mg by mouth at bedtime.    Historical Provider, MD  Multiple Vitamins-Minerals (CENTRUM SILVER PO) Take 1 tablet by mouth daily.    Historical Provider, MD  PARoxetine (PAXIL) 20 MG tablet  01/07/14   Historical Provider, MD  polyethylene glycol (MIRALAX / GLYCOLAX) packet Take 17 g by mouth daily as needed (constipation).     Historical Provider, MD  valsartan (DIOVAN) 160 MG tablet Take 160 mg by mouth daily.    Historical Provider, MD  zolpidem (AMBIEN) 10 MG tablet Take 1 tablet by mouth daily. 08/28/13   Historical Provider, MD    ALLERGIES:  Allergies  Allergen Reactions  . Codeine     Hyper  . Demerol [Meperidine]     hyper  . Valium [Diazepam]     hyper    SOCIAL HISTORY:  History  Substance Use Topics  . Smoking status: Never Smoker   . Smokeless tobacco: Never Used  . Alcohol Use: 0.0 oz/week    0 Glasses of wine per week    FAMILY HISTORY: Family History  Problem Relation Age of Onset  . Stroke Father   . Cancer  Sister   . Cancer Son 68    leiomyosarcoma    EXAM: BP 162/77 mmHg  Pulse 89  Temp(Src) 98 F (36.7 C) (Oral)  Resp 22  Ht 5\' 1"  (1.549 m)  Wt 83 lb (37.649 kg)  BMI 15.69 kg/m2  SpO2 100% CONSTITUTIONAL: Alert and oriented and responds appropriately to questions. Appears uncomfortable, thin, chronically ill-appearing, GCS 15 HEAD: Normocephalic; atraumatic EYES: Conjunctivae clear, PERRL, EOMI ENT: normal nose; no rhinorrhea; moist mucous membranes; pharynx without lesions noted; no dental injury; no septal hematoma NECK: Supple, no meningismus, no LAD; no midline spinal tenderness, step-off or deformity CARD: RRR; S1 and S2 appreciated; no murmurs, no clicks, no rubs, no  gallops RESP: Normal chest excursion without splinting or tachypnea; breath sounds clear and equal bilaterally; no wheezes, no rhonchi, no rales; chest wall stable, tender to palpation over the right chest wall without crepitus or ecchymosis or deformity, no flail chest, good aeration, no hypoxia ABD/GI: Normal bowel sounds; non-distended; soft, non-tender, no rebound, no guarding PELVIS:  stable, nontender to palpation BACK:  The back appears normal and is non-tender to palpation, there is no CVA tenderness; no midline spinal tenderness, step-off or deformity EXT: Normal ROM in all joints; non-tender to palpation; no edema; normal capillary refill; no cyanosis    SKIN: Normal color for age and race; warm NEURO: Moves all extremities equally, sensation to light touch intact diffusely, cranial nerves II through XII intact PSYCH: The patient's mood and manner are appropriate. Grooming and personal hygiene are appropriate.  MEDICAL DECISION MAKING: Patient here with mechanical fall secondary to her PSP with right rib pain. X-ray show acute 7th and 8th rib fractures but no pneumothorax. No other sign of injury on exam. No head injury. She is hemodynamically stable without respiratory distress.  I will discharge the patient home with pain medication, incentive spirometer. Discussed return precautions. She verbalized understanding and is comfortable with plan.       [place], DO 12/31/14 [phone]

## 2014-12-31 NOTE — ED Notes (Signed)
Patient transported to X-ray 

## 2014-12-31 NOTE — Discharge Instructions (Signed)

## 2015-01-15 ENCOUNTER — Other Ambulatory Visit: Payer: Self-pay | Admitting: *Deleted

## 2015-01-15 DIAGNOSIS — C50919 Malignant neoplasm of unspecified site of unspecified female breast: Secondary | ICD-10-CM

## 2015-01-16 ENCOUNTER — Other Ambulatory Visit (HOSPITAL_BASED_OUTPATIENT_CLINIC_OR_DEPARTMENT_OTHER): Payer: Medicare Other

## 2015-01-16 ENCOUNTER — Ambulatory Visit (HOSPITAL_BASED_OUTPATIENT_CLINIC_OR_DEPARTMENT_OTHER): Payer: Medicare Other | Admitting: Hematology and Oncology

## 2015-01-16 VITALS — BP 128/70 | HR 96 | Temp 98.4°F | Resp 18 | Ht 61.0 in | Wt 87.1 lb

## 2015-01-16 DIAGNOSIS — Z853 Personal history of malignant neoplasm of breast: Secondary | ICD-10-CM | POA: Diagnosis not present

## 2015-01-16 DIAGNOSIS — G231 Progressive supranuclear ophthalmoplegia [Steele-Richardson-Olszewski]: Secondary | ICD-10-CM | POA: Diagnosis not present

## 2015-01-16 DIAGNOSIS — C50919 Malignant neoplasm of unspecified site of unspecified female breast: Secondary | ICD-10-CM

## 2015-01-16 DIAGNOSIS — C50911 Malignant neoplasm of unspecified site of right female breast: Secondary | ICD-10-CM

## 2015-01-16 LAB — COMPREHENSIVE METABOLIC PANEL (CC13)
ALK PHOS: 115 U/L (ref 40–150)
ALT: 11 U/L (ref 0–55)
AST: 24 U/L (ref 5–34)
Albumin: 3.9 g/dL (ref 3.5–5.0)
Anion Gap: 10 mEq/L (ref 3–11)
BUN: 15.5 mg/dL (ref 7.0–26.0)
CALCIUM: 9.4 mg/dL (ref 8.4–10.4)
CHLORIDE: 101 meq/L (ref 98–109)
CO2: 27 mEq/L (ref 22–29)
Creatinine: 0.7 mg/dL (ref 0.6–1.1)
EGFR: 85 mL/min/{1.73_m2} — ABNORMAL LOW (ref >90)
GLUCOSE: 90 mg/dL (ref 70–140)
POTASSIUM: 4.9 meq/L (ref 3.5–5.1)
Sodium: 138 mEq/L (ref 136–145)
Total Bilirubin: 0.48 mg/dL (ref 0.20–1.20)
Total Protein: 7 g/dL (ref 6.4–8.3)

## 2015-01-16 LAB — CBC WITH DIFFERENTIAL/PLATELET
BASO%: 0.6 % (ref 0.0–2.0)
Basophils Absolute: 0.1 10*3/uL (ref 0.0–0.1)
EOS%: 0.5 % (ref 0.0–7.0)
Eosinophils Absolute: 0 10*3/uL (ref 0.0–0.5)
HCT: 37.7 % (ref 34.8–46.6)
HGB: 12.4 g/dL (ref 11.6–15.9)
LYMPH#: 1.3 10*3/uL (ref 0.9–3.3)
LYMPH%: 14.8 % (ref 14.0–49.7)
MCH: 30.2 pg (ref 25.1–34.0)
MCHC: 32.9 g/dL (ref 31.5–36.0)
MCV: 91.7 fL (ref 79.5–101.0)
MONO#: 0.8 10*3/uL (ref 0.1–0.9)
MONO%: 9.6 % (ref 0.0–14.0)
NEUT#: 6.3 10*3/uL (ref 1.5–6.5)
NEUT%: 74.5 % (ref 38.4–76.8)
Platelets: 412 10*3/uL — ABNORMAL HIGH (ref 145–400)
RBC: 4.11 10*6/uL (ref 3.70–5.45)
RDW: 13.8 % (ref 11.2–14.5)
WBC: 8.5 10*3/uL (ref 3.9–10.3)

## 2015-01-16 NOTE — Assessment & Plan Note (Signed)
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 Left and right breast biopsies showed fibrocystic changes and periductal perilobular fibrosis  Breast cancer surveillance: 1. Breast exam 01/16/2015 is normal 2. Mammogram 02/08/2014 is normal   Return to clinic in 1 year for follow-up to survivorship clinic

## 2015-01-16 NOTE — Progress Notes (Signed)
Patient Care Team: Georga Kaufmann, MD as PCP - General (Internal Medicine)  DIAGNOSIS: No matching staging information was found for the patient.  SUMMARY OF ONCOLOGIC HISTORY:   Breast cancer, right breast   01/15/1997 Surgery Right breast lower inner quadrant: Intraductal carcinoma with microscopic invasive ductal carcinoma grade 1, ER positive, PR positive stage IA lymph node negative   07/18/1997 - 08/23/1997 Radiation Therapy Adjuvant radiation therapy   09/24/1997 - 09/24/2002 Anti-estrogen oral therapy Tamoxifen 5 years   06/15/2007 Initial Biopsy Left breast biopsy: Fibrocystic changes with a ductal and perilobular fibrosis: Right breast biopsy: Fibrocystic changes with dense periductal and perilobular features   06/02/2009 Initial Biopsy Left breast biopsy: Fibrocystic changes with dense nodular periductal and very lobular fibrosis, intraductal microcalcifications    CHIEF COMPLIANT: Follow-up of breast cancer  INTERVAL HISTORY: Miranda [name] is a 69 year old lady with above-mentioned history of right breast cancer diagnosed in 1998 treated with surgery followed by radiation 5 years of tamoxifen therapy. In 2008 and 2010 she had breast biopsies which were benign. She also has a diagnosis of progressive supranuclear paralysis. She is under the care of neurology at [facility] and at [facility]. She does not have any breast problems currently. She reports multiple problems related to PSP.  REVIEW OF SYSTEMS:   Constitutional: Denies fevers, chills or abnormal weight loss Eyes: Denies blurriness of vision Ears, nose, mouth, throat, and face: Denies mucositis or sore throat Respiratory: Denies cough, dyspnea or wheezes Cardiovascular: Denies palpitation, chest discomfort or lower extremity swelling Gastrointestinal:  Denies nausea, heartburn or change in bowel habits Skin: Denies abnormal skin rashes Lymphatics: Denies new lymphadenopathy or easy bruising Neurological:  Progressive supranuclear paralysis causing muscle stiffness and lack of control of her legs and lower half of the body. She is still able to control her upper extremities. Her speech is somewhat stumbled. But she is able to speak in full sentences. Behavioral/Psych: Mood is stable, no new changes  Breast:  denies any pain or lumps or nodules in either breasts All other systems were reviewed with the patient and are negative.  I have reviewed the past medical history, past surgical history, social history and family history with the patient and they are unchanged from previous note.  ALLERGIES:  is allergic to codeine; demerol; and valium.  MEDICATIONS:  Current Outpatient Prescriptions  Medication Sig Dispense Refill  . alendronate (FOSAMAX) 70 MG tablet Take 70 mg by mouth every 7 (seven) days. Take with a full glass of water on an empty stomach.    Marland Kitchen buPROPion (WELLBUTRIN XL) 300 MG 24 hr tablet Take 300 mg by mouth daily.    . Calcium Citrate (CITRACAL PO) Take 630 mg by mouth daily.     . carbidopa-levodopa (SINEMET IR) 25-100 MG per tablet Take 1 tablet by mouth 3 (three) times daily. Takes 1/2 doses     And whole tablet for 2nd and third doses    . carboxymethylcellulose (REFRESH PLUS) 0.5 % SOLN Place 1 drop into both eyes every 2 (two) hours as needed (dry eyes).    Marland Kitchen docusate sodium (COLACE) 100 MG capsule Take 1 capsule (100 mg total) by mouth every 12 (twelve) hours. 60 capsule 0  . furosemide (LASIX) 20 MG tablet Take 20 mg by mouth daily.  0  . HYDROcodone-acetaminophen (NORCO) 10-325 MG per tablet   0  . LORazepam (ATIVAN) 0.5 MG tablet     . Multiple Vitamins-Minerals (CENTRUM SILVER PO) Take 1 tablet by mouth  daily.    . oxyCODONE-acetaminophen (PERCOCET) 7.5-325 MG per tablet   0  . oxyCODONE-acetaminophen (PERCOCET/ROXICET) 5-325 MG per tablet Take 1-2 tablets by mouth every 6 (six) hours as needed. 30 tablet 0  . PARoxetine (PAXIL) 20 MG tablet     . polyethylene glycol  (MIRALAX / GLYCOLAX) packet Take 17 g by mouth daily as needed (constipation).     . valsartan (DIOVAN) 160 MG tablet Take 160 mg by mouth daily.    Marland Kitchen zolpidem (AMBIEN) 10 MG tablet Take 1 tablet by mouth daily.     No current facility-administered medications for this visit.    PHYSICAL EXAMINATION: ECOG PERFORMANCE STATUS: 3 - Symptomatic, >50% confined to bed  Filed Vitals:   01/16/15 1403  BP: 128/70  Pulse: 96  Temp: 98.4 F (36.9 C)  Resp: 18   Filed Weights   01/16/15 1403  Weight: 87 lb 1.6 oz (39.508 kg)    GENERAL:alert, no distress and comfortable SKIN: skin color, texture, turgor are normal, no rashes or significant lesions EYES: normal, Conjunctiva are pink and non-injected, sclera clear OROPHARYNX:no exudate, no erythema and lips, buccal mucosa, and tongue normal  NECK: supple, thyroid normal size, non-tender, without nodularity LYMPH:  no palpable lymphadenopathy in the cervical, axillary or inguinal LUNGS: clear to auscultation and percussion with normal breathing effort HEART: regular rate & rhythm and no murmurs and no lower extremity edema ABDOMEN:abdomen soft, non-tender and normal bowel sounds Musculoskeletal:no cyanosis of digits and no clubbing  NEURO: alert & oriented x 3 with fluent speech, no focal motor/sensory deficits BREAST: No palpable masses or nodules in either right or left breasts. No palpable axillary supraclavicular or infraclavicular adenopathy no breast tenderness or nipple discharge. (exam performed in the presence of a chaperone)  LABORATORY DATA:  I have reviewed the data as listed   Chemistry      Component Value Date/Time   NA 139 01/14/2014 1404   K 4.3 01/14/2014 1404   CL 100 01/08/2013 1458   CO2 28 01/14/2014 1404   BUN 18.4 01/14/2014 1404   CREATININE 0.9 01/14/2014 1404      Component Value Date/Time   CALCIUM 9.8 01/14/2014 1404   ALKPHOS 78 01/14/2014 1404   AST 28 01/14/2014 1404   ALT 10 01/14/2014 1404    BILITOT 0.57 01/14/2014 1404       Lab Results  Component Value Date   WBC 8.5 01/16/2015   HGB 12.4 01/16/2015   HCT 37.7 01/16/2015   MCV 91.7 01/16/2015   PLT 412* 01/16/2015   NEUTROABS 6.3 01/16/2015   ASSESSMENT & PLAN:  Breast cancer, right breast 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 Left and right breast biopsies showed fibrocystic changes and periductal perilobular fibrosis  Breast cancer surveillance: 1. Breast exam 01/16/2015 is normal 2. Mammogram 02/08/2014 is normal   Progressive supranuclear paralysis: Patient is under the care of neurologist at [facility] and [facility]. She takes Sinemet.  Since she is nearly 20 years from diagnosis, I discussed with them that they could be followed by primary care physician for their annual breast exams and mammograms. Patient and her husband are both agreeable with this and they will call us if there were any questions or concerns. Patient will be seen on an as-needed basis.  No orders of the defined  types were placed in this encounter.   The patient has a good understanding of the overall plan. she agrees with it. She will call with any problems that may develop before her next visit here.   Rulon Eisenmenger, MD

## 2015-01-28 ENCOUNTER — Ambulatory Visit: Payer: Medicare Other | Admitting: Occupational Therapy

## 2015-01-28 ENCOUNTER — Ambulatory Visit: Payer: Medicare Other | Attending: Neurology | Admitting: Physical Therapy

## 2015-01-28 DIAGNOSIS — R279 Unspecified lack of coordination: Secondary | ICD-10-CM

## 2015-01-28 DIAGNOSIS — R269 Unspecified abnormalities of gait and mobility: Secondary | ICD-10-CM

## 2015-01-28 NOTE — Therapy (Signed)
[facility] [address] [place], [place], [address] Phone: [phone]   Fax:  [phone]  Patient Details  Name: Miranda [name] MRN: [id number] Date of Birth: [birth year] Referring Provider:  Georga Kaufmann, MD  Encounter Date: 01/28/2015  Physical Therapy Parkinson's Disease Screen   Timed Up and Go test:Unable to perform  10 meter walk test: 120 seconds with therapist assistance; husband able to help sequence walking better with weightshifting and with calling out cadence.  5 time sit to stand test:unable to perform due to significant posterior lean.   Patient does not require Physical Therapy services at this time.  Recommend Physical Therapy screen in 6-8 months.  Pt has had history of at least 2 rib fractures in the last 3 weeks.  Pt is reporting at least 2 falls per week.  Husband reports doing their best to keep patient from falling.  She is going to [facility] 3 times per week, and per pt and husband report, she is "holding her own" compared to when here for previous therapy.   Launi Asencio W. 01/28/2015, 1:18 PM  Matricia Begnaud Gerrit Friends, PT 01/28/2015 2:01 PM Phone: [phone] Fax: [facility] [address] [place], [place], [address] Phone: [phone]   Fax:  [phone]

## 2015-01-28 NOTE — Therapy (Signed)
[facility] [address] [place], [place], [address] Phone: [phone]   Fax:  [phone]  Patient Details  Name: Miranda [name] MRN: [id number] Date of Birth: September 13, 1946 Referring Provider:  Day, Jacqlyn Krauss, MD  Encounter Date: 01/28/2015  Occupational Therapy Parkinson's Disease Screen  Change in ability to perform ADLs/IADLs:  Difficulty cutting meat and using computer, but no significant increase in falls/decline in ADLs from last round of therapy per pt/husband.  Pt/husband report that they are compensating well for deficits and have adapted routine and caregiver assistance needed and report no occupational therapy concerns.  Other Comments:  Pt/husband given info for [employer] [facility] [employer].    Pt does not require occupation therapy services at this time.  Recommended occupational therapy screen in   approx 6-80months.   [facility] 01/28/2015, 1:40 PM  [facility] [address] [place], [place], [address] Phone: [phone]   Fax:  [phone]   Vianne Bulls, OTR/L 01/28/2015 1:40 PM

## 2015-02-24 ENCOUNTER — Ambulatory Visit
Admission: RE | Admit: 2015-02-24 | Discharge: 2015-02-24 | Disposition: A | Discharge status: Home / Self Care | Payer: Medicare Other | Source: Ambulatory Visit

## 2015-02-24 DIAGNOSIS — Z1231 Encounter for screening mammogram for malignant neoplasm of breast: Secondary | ICD-10-CM

## 2015-04-12 ENCOUNTER — Emergency Department (HOSPITAL_COMMUNITY)
Admission: EM | Admit: 2015-04-12 | Discharge: 2015-04-12 | Disposition: A | Discharge status: Home / Self Care | Payer: Medicare Other | Attending: Emergency Medicine | Admitting: Emergency Medicine

## 2015-04-12 ENCOUNTER — Encounter (HOSPITAL_COMMUNITY): Payer: Self-pay | Admitting: Emergency Medicine

## 2015-04-12 ENCOUNTER — Emergency Department (HOSPITAL_COMMUNITY): Payer: Medicare Other

## 2015-04-12 DIAGNOSIS — Z79899 Other long term (current) drug therapy: Secondary | ICD-10-CM | POA: Diagnosis not present

## 2015-04-12 DIAGNOSIS — S62631A Displaced fracture of distal phalanx of left index finger, initial encounter for closed fracture: Secondary | ICD-10-CM

## 2015-04-12 DIAGNOSIS — G20 Parkinson's disease: Secondary | ICD-10-CM | POA: Diagnosis not present

## 2015-04-12 DIAGNOSIS — I10 Essential (primary) hypertension: Secondary | ICD-10-CM | POA: Diagnosis not present

## 2015-04-12 DIAGNOSIS — S62611A Displaced fracture of proximal phalanx of left index finger, initial encounter for closed fracture: Secondary | ICD-10-CM | POA: Diagnosis not present

## 2015-04-12 DIAGNOSIS — F329 Major depressive disorder, single episode, unspecified: Secondary | ICD-10-CM | POA: Diagnosis not present

## 2015-04-12 DIAGNOSIS — Z853 Personal history of malignant neoplasm of breast: Secondary | ICD-10-CM | POA: Diagnosis not present

## 2015-04-12 DIAGNOSIS — Y9389 Activity, other specified: Secondary | ICD-10-CM | POA: Diagnosis not present

## 2015-04-12 DIAGNOSIS — S6992XA Unspecified injury of left wrist, hand and finger(s), initial encounter: Secondary | ICD-10-CM | POA: Diagnosis present

## 2015-04-12 DIAGNOSIS — Y9289 Other specified places as the place of occurrence of the external cause: Secondary | ICD-10-CM | POA: Insufficient documentation

## 2015-04-12 DIAGNOSIS — Y998 Other external cause status: Secondary | ICD-10-CM | POA: Insufficient documentation

## 2015-04-12 DIAGNOSIS — W07XXXA Fall from chair, initial encounter: Secondary | ICD-10-CM | POA: Diagnosis not present

## 2015-04-12 MED ORDER — IBUPROFEN 200 MG PO TABS
400.0000 mg | ORAL_TABLET | Freq: Once | ORAL | Status: AC
  Administered 2015-04-12: 400 mg via ORAL
  Filled 2015-04-12: qty 2

## 2015-04-12 MED ORDER — METHOCARBAMOL 500 MG PO TABS
500.0000 mg | ORAL_TABLET | Freq: Once | ORAL | Status: AC
  Administered 2015-04-12: 500 mg via ORAL
  Filled 2015-04-12: qty 1

## 2015-04-12 NOTE — ED Notes (Signed)
Pt taken to x-ray and returned to room without distress noted. 

## 2015-04-12 NOTE — ED Notes (Signed)
Noted obvious deformity to lt 2nd finger s/p to falling out of chair. (+)PMS, CRT brisk, LROM, no bruising noted. (-)LOC, denies hitting head, dizziness/lightheadedness or visual disturbances. Family at bedside.

## 2015-04-12 NOTE — ED Notes (Addendum)
Pt reports she fell out of chair and injured L index finger. Deformity noted. Denies any other injury

## 2015-04-12 NOTE — Discharge Instructions (Signed)
You may take your percocet as prescribed for pain.  Be sure to keep your Left hand elevated to help with swelling and pain. See below for further instructions.   Cast or Splint Care Casts and splints support injured limbs and keep bones from moving while they heal.  HOME CARE  Keep the cast or splint uncovered during the drying period.  A plaster cast can take 24 to 48 hours to dry.  A fiberglass cast will dry in less than 1 hour.  Do not rest the cast on anything harder than a pillow for 24 hours.  Do not put weight on your injured limb. Do not put pressure on the cast. Wait for your doctor's approval.  Keep the cast or splint dry.  Cover the cast or splint with a plastic bag during baths or wet weather.  If you have a cast over your chest and belly (trunk), take sponge baths until the cast is taken off.  If your cast gets wet, dry it with a towel or blow dryer. Use the cool setting on the blow dryer.  Keep your cast or splint clean. Wash a dirty cast with a damp cloth.  Do not put any objects under your cast or splint.  Do not scratch the skin under the cast with an object. If itching is a problem, use a blow dryer on a cool setting over the itchy area.  Do not trim or cut your cast.  Do not take out the padding from inside your cast.  Exercise your joints near the cast as told by your doctor.  Raise (elevate) your injured limb on 1 or 2 pillows for the first 1 to 3 days. GET HELP IF:  Your cast or splint cracks.  Your cast or splint is too tight or too loose.  You itch badly under the cast.  Your cast gets wet or has a soft spot.  You have a bad smell coming from the cast.  You get an object stuck under the cast.  Your skin around the cast becomes red or sore.  You have new or more pain after the cast is put on. GET HELP RIGHT AWAY IF:  You have fluid leaking through the cast.  You cannot move your fingers or toes.  Your fingers or toes turn blue or  white or are cool, painful, or puffy (swollen).  You have tingling or lose feeling (numbness) around the injured area.  You have bad pain or pressure under the cast.  You have trouble breathing or have shortness of breath.  You have chest pain. Document Released: 02/17/2011 Document Revised: 06/20/2013 Document Reviewed: 04/26/2013 [facility] Patient Information 2015 [place], [place]. This information is not intended to replace advice given to you by your health care provider. Make sure you discuss any questions you have with your health care provider.

## 2015-04-12 NOTE — ED Provider Notes (Signed)
CSN: [id number]     Arrival date & time 04/12/15  0845 History   First MD Initiated Contact with Patient 04/12/15 [phone]     Chief Complaint  Patient presents with  . Finger Injury     (Consider location/radiation/quality/duration/timing/severity/associated sxs/prior Treatment) HPI  Pt is a 69yo female with hx of Parkinson's disease, progressive supranuclear palsy and multiple falls, presenting to ED with husband, c/o Left index finger pain after pt fell out of a chair around 8AM this morning. Pt states she took percocet 10mg  PTA w/o relief of pain.  Pain is constant, worse with palpation and movement of Left index finger. Pt denies other injuries including denying hitting head or LOC.  Pt is left hand dominant. States she has seen Dr. Amedeo Plenty, hand surgery, in the past for other hand injuries.    Past Medical History  Diagnosis Date  . Breast cancer 1998    lumpectomy  . Parkinson's disease 2012  . Depression   . Hypertension   . Progressive supranuclear palsy   . Multiple falls    Past Surgical History  Procedure Laterality Date  . Lumpectomy right Right 1998    stage IA, DCIS with micrinvasion, ER+/PR+, , right breast lower inner quadrant  . Cesarean section  1975  . Anterior release vertebral body w/ posterior fusion  2010    L1-S1  . Spinal fusion     Family History  Problem Relation Age of Onset  . Stroke Father   . Cancer Sister   . Cancer Son 72    leiomyosarcoma   History  Substance Use Topics  . Smoking status: Never Smoker   . Smokeless tobacco: Never Used  . Alcohol Use: 0.0 oz/week    0 Glasses of wine per week   OB History    No data available     Review of Systems  Musculoskeletal: Positive for myalgias, joint swelling and arthralgias.       Left index finger  Skin: Negative for color change and wound.  Neurological: Positive for weakness ( due to pain in Left index finger). Negative for numbness.  All other systems reviewed and are  negative.     Allergies  Codeine; Demerol; and Valium  Home Medications   Prior to Admission medications   Medication Sig Start Date End Date Taking? Authorizing Provider  alendronate (FOSAMAX) 70 MG tablet Take 70 mg by mouth every 7 (seven) days. Take with a full glass of water on an empty stomach.    Historical Provider, MD  buPROPion (WELLBUTRIN XL) 300 MG 24 hr tablet Take 300 mg by mouth daily.    Historical Provider, MD  Calcium Citrate (CITRACAL PO) Take 630 mg by mouth daily.     Historical Provider, MD  carbidopa-levodopa (SINEMET IR) 25-100 MG per tablet Take 1 tablet by mouth 3 (three) times daily. Takes 1/2 doses     And whole tablet for 2nd and third doses    Historical Provider, MD  carboxymethylcellulose (REFRESH PLUS) 0.5 % SOLN Place 1 drop into both eyes every 2 (two) hours as needed (dry eyes).    Historical Provider, MD  docusate sodium (COLACE) 100 MG capsule Take 1 capsule (100 mg total) by mouth every 12 (twelve) hours. 12/31/14   Kristen N Ward, DO  furosemide (LASIX) 20 MG tablet Take 20 mg by mouth daily. 11/06/14   Historical Provider, MD  HYDROcodone-acetaminophen (NORCO) 10-325 MG per tablet  11/06/14   Historical Provider, MD  LORazepam (ATIVAN) 0.5 MG  tablet  12/22/14   Historical Provider, MD  Multiple Vitamins-Minerals (CENTRUM SILVER PO) Take 1 tablet by mouth daily.    Historical Provider, MD  oxyCODONE-acetaminophen (PERCOCET) 7.5-325 MG per tablet  12/21/14   Historical Provider, MD  oxyCODONE-acetaminophen (PERCOCET/ROXICET) 5-325 MG per tablet Take 1-2 tablets by mouth every 6 (six) hours as needed. 12/31/14   Kristen N Ward, DO  PARoxetine (PAXIL) 20 MG tablet  01/07/14   Historical Provider, MD  polyethylene glycol (MIRALAX / GLYCOLAX) packet Take 17 g by mouth daily as needed (constipation).     Historical Provider, MD  valsartan (DIOVAN) 160 MG tablet Take 160 mg by mouth daily.    Historical Provider, MD  zolpidem (AMBIEN) 10 MG tablet Take 1 tablet by  mouth daily. 08/28/13   Historical Provider, MD   BP 153/83 mmHg  Pulse 98  Temp(Src) 98.1 F (36.7 C) (Oral)  Resp 16  SpO2 98% Physical Exam  Constitutional: She is oriented to person, place, and time. She appears well-developed and well-nourished.  HENT:  Head: Normocephalic and atraumatic.  Eyes: EOM are normal.  Neck: Normal range of motion.  Cardiovascular: Normal rate.   Left index finger: Cap refill <3 seconds  Pulmonary/Chest: Effort normal.  Musculoskeletal: She exhibits tenderness. She exhibits no edema.  Left index finger: deformity to proximal aspect, FROM at PIP and DIP, limited ROM of MCP. Tenderness to MCP and proximal phalanx.    Neurological: She is alert and oriented to person, place, and time.  Left index finger: sensation in tact, symmetric compared to other fingers of left hand.  Skin: Skin is warm and dry.  Left index finger: skin in tact, no ecchymosis or erythema.  Psychiatric: She has a normal mood and affect. Her behavior is normal.  Nursing note and vitals reviewed.   ED Course  Procedures (including critical care time) Labs Review Labs Reviewed - No data to display  Imaging Review Dg Hand Complete Left  04/12/2015   CLINICAL DATA:  Recent fall with left hand pain, initial encounter  EXAM: LEFT HAND - COMPLETE 3+ VIEW  COMPARISON:  09/11/2013  FINDINGS: There is a minimally angulated fracture at the base of the second proximal phalanx. No other fractures are seen. Mild degenerative changes in the carpal bones are noted.  IMPRESSION: Fracture through the base of the second proximal phalanx.   Electronically Signed   By: Inez Catalina M.D.   On: 04/12/2015 09:28     EKG Interpretation None      MDM   Final diagnoses:  Closed displaced fracture of distal phalanx of left index finger, initial encounter   Pt is a 69yo female presenting to ED with Left index finger pain, obvious deformity. Finger is neurovascularly in tact. Plain films: significant  for fracture through base of 2nd proximal phalanx. Will splint finger and have pt call to schedule f/u appointment with hand surgery. Pt requests to use Dr. Amedeo Plenty again, hand surgery.  Home care instructions provided. Return precautions provided. Pt and husband verbalized understanding and agreement with tx plan.    Noland Fordyce, PA-C 04/12/15 1594  Pamella Pert, MD 04/13/15 1055

## 2015-04-12 NOTE — ED Notes (Signed)
Awake. Verbally responsive. A/O x4. Resp even and unlabored. No audible adventitious breath sounds noted. ABC's intact.  

## 2015-07-14 IMAGING — CT CT HEAD W/O CM
2 series · 17 of 30 positions shown, 20 images · non-contrast
Comparison: 09/11/2013

CLINICAL DATA: Tripped and fell into bed side table striking
right-side of head and face

EXAM:
CT HEAD WITHOUT CONTRAST
TECHNIQUE: Contiguous axial images were obtained from the base of the skull
through the vertex without intravenous contrast.

[Series 2: head w/o · axial · non-contrast · 0.43mm/px · z∈[-178,-58]mm · 9 of 31 slices shown, 12 images]
[im 4/31  brain]
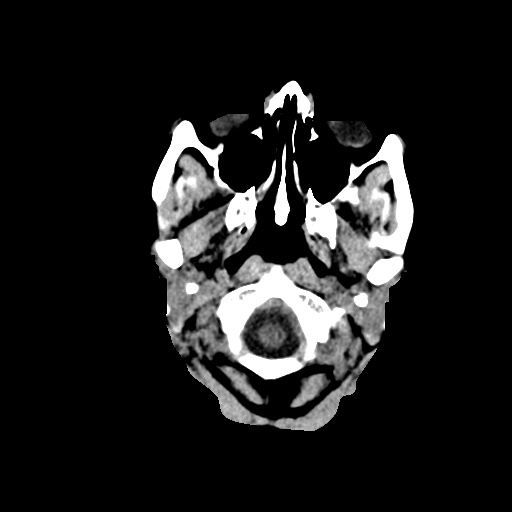
[im 4/31  bone]
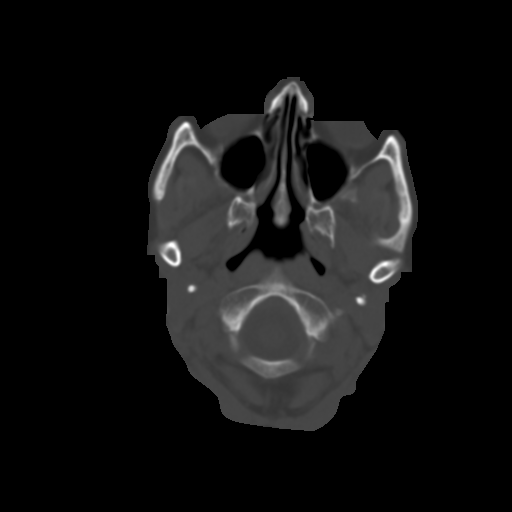
[im 7/31  brain]
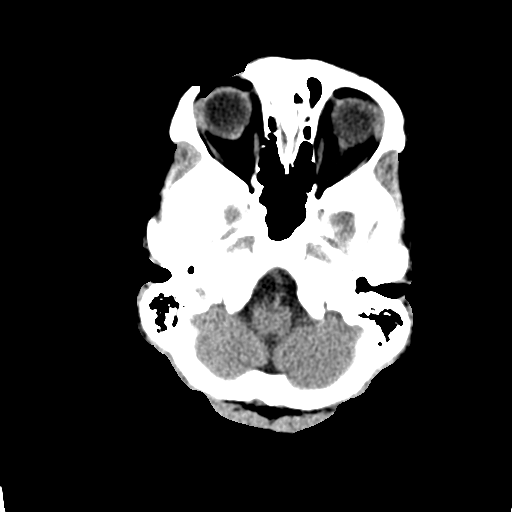
[im 10/31  brain]
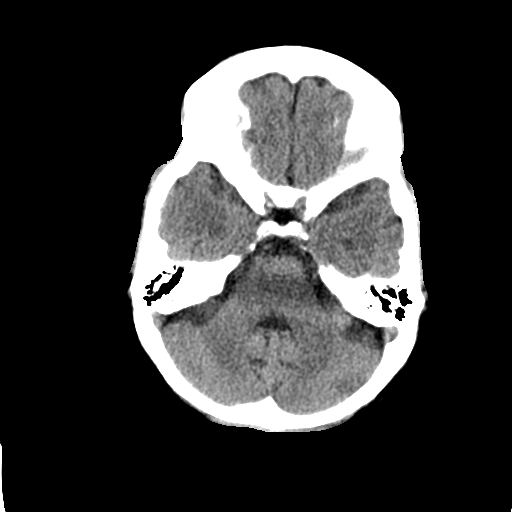
[im 13/31  brain]
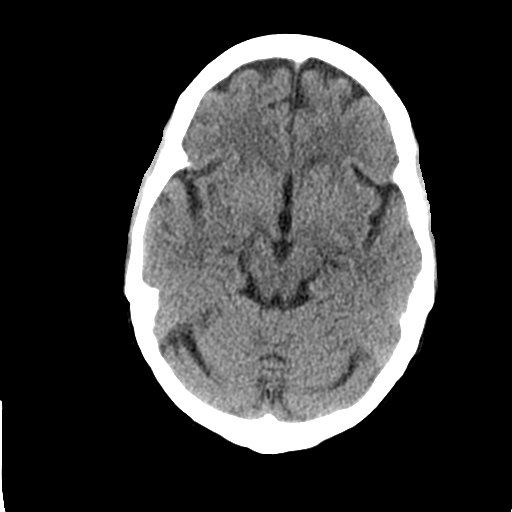
[im 16/31  brain]
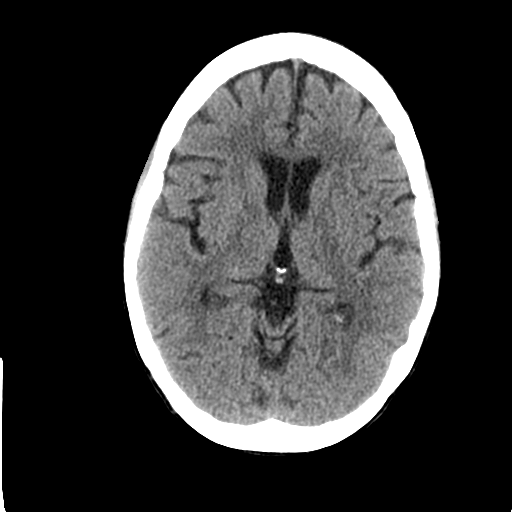
[im 16/31  bone]
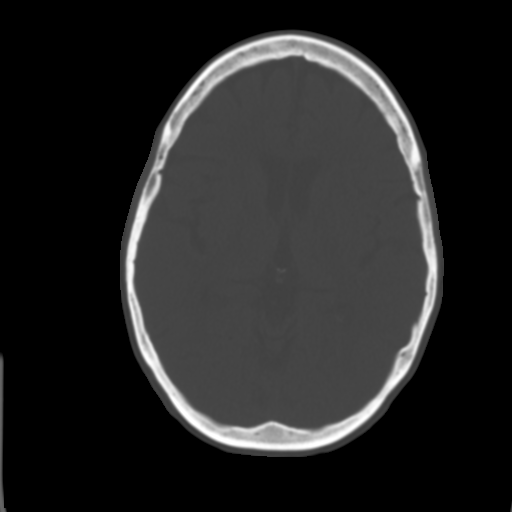
[im 19/31  brain]
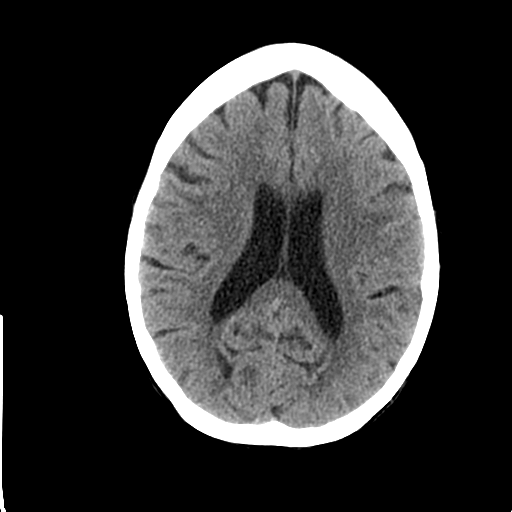
[im 22/31  brain]
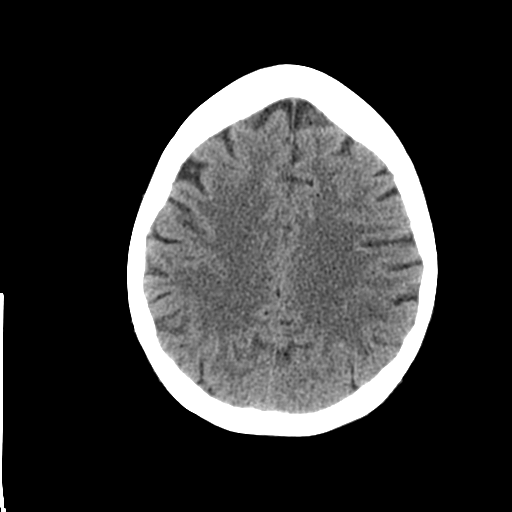
[im 25/31  brain]
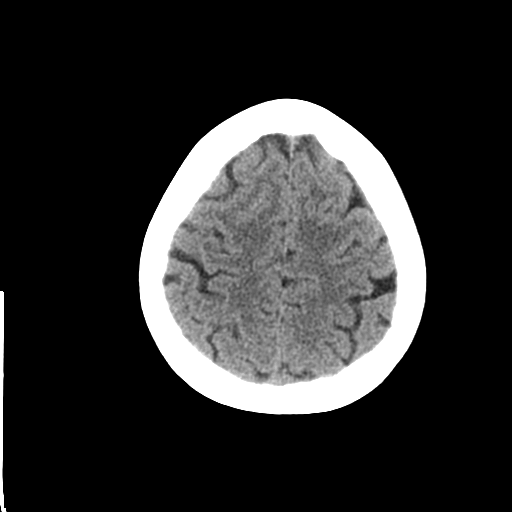
[im 28/31  brain]
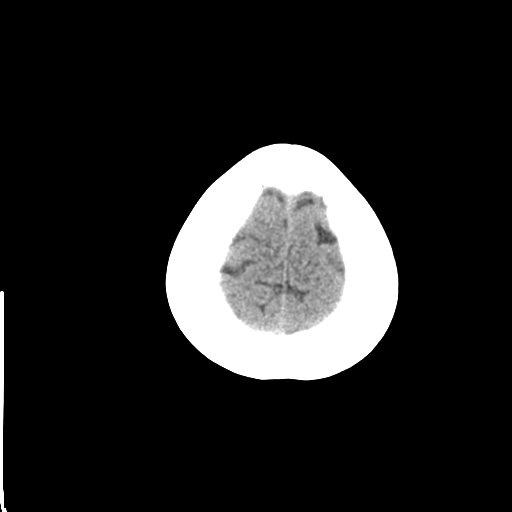
[im 28/31  bone]
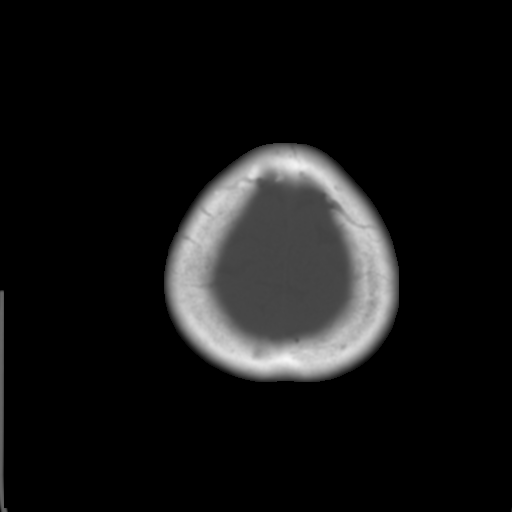

[Series 3: bone windows · axial · 0.43mm/px · z∈[-178,-61]mm · 8 of 51 slices shown]
[im 6/51  bone]
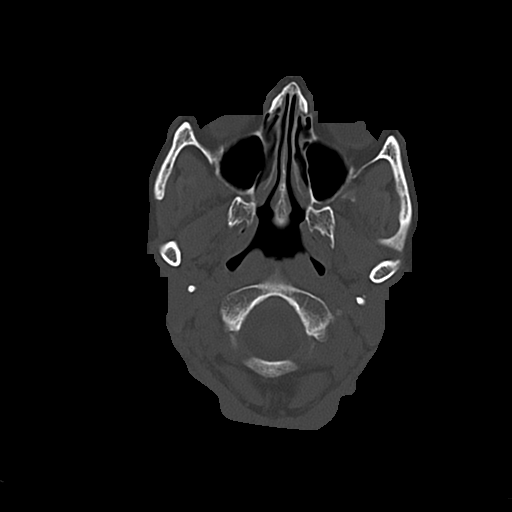
[im 12/51  bone]
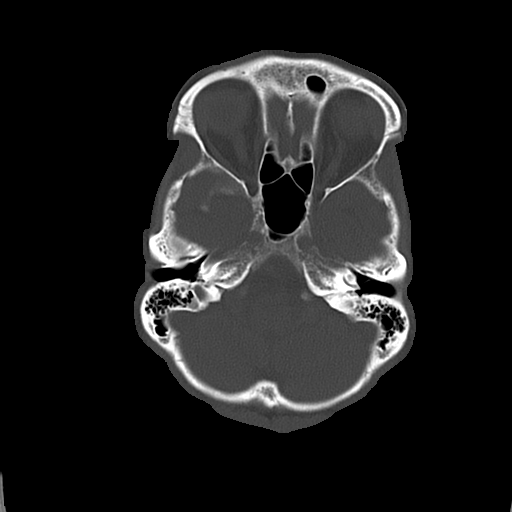
[im 17/51  bone]
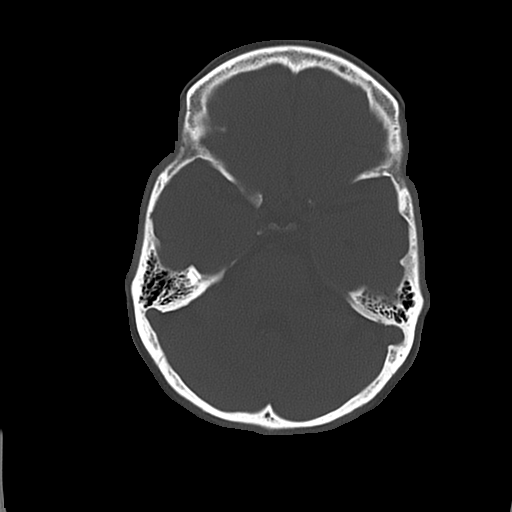
[im 23/51  bone]
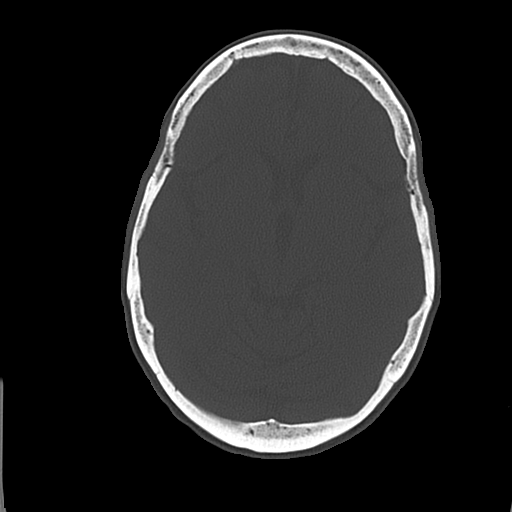
[im 28/51  bone]
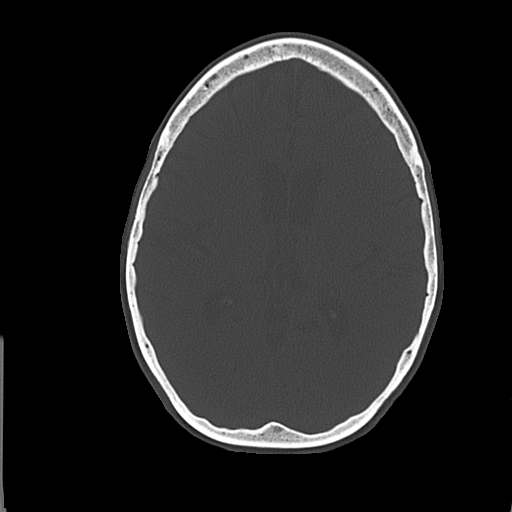
[im 34/51  bone]
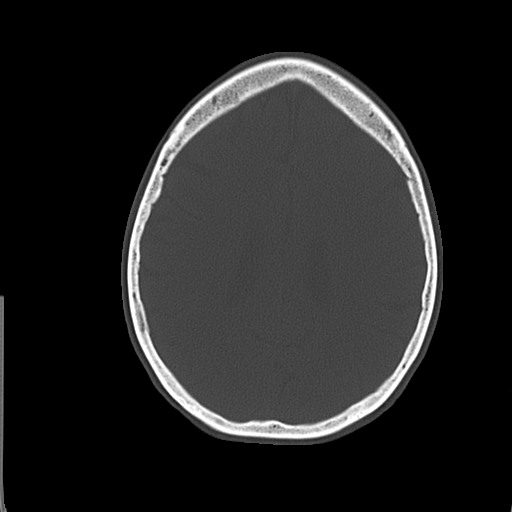
[im 39/51  bone]
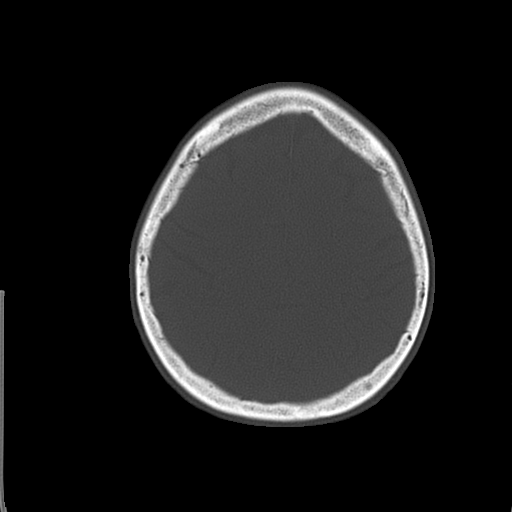
[im 45/51  bone]
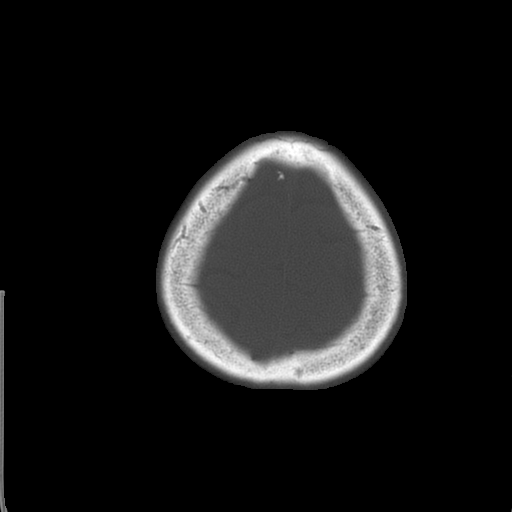

[17 of 30 positions shown; findings below may reference images not displayed]

FINDINGS: Mild generalized atrophy.

Normal ventricular morphology.

No midline shift or mass effect.

Otherwise normal appearance of brain parenchyma.

No intracranial hemorrhage, mass lesion, or acute infarction.

Visualized paranasal sinuses clear.

Bones unremarkable.

Minimal RIGHT supraorbital scalp hematoma.
IMPRESSION: Mild generalized atrophy.

No acute intracranial abnormalities.

## 2015-08-24 ENCOUNTER — Emergency Department (HOSPITAL_COMMUNITY)
Admission: EM | Admit: 2015-08-24 | Discharge: 2015-08-24 | Disposition: A | Discharge status: Home / Self Care | Payer: Medicare Other | Attending: Emergency Medicine | Admitting: Emergency Medicine

## 2015-08-24 ENCOUNTER — Emergency Department (HOSPITAL_COMMUNITY): Payer: Medicare Other

## 2015-08-24 ENCOUNTER — Encounter (HOSPITAL_COMMUNITY): Payer: Self-pay | Admitting: Emergency Medicine

## 2015-08-24 DIAGNOSIS — Z79899 Other long term (current) drug therapy: Secondary | ICD-10-CM | POA: Insufficient documentation

## 2015-08-24 DIAGNOSIS — F329 Major depressive disorder, single episode, unspecified: Secondary | ICD-10-CM | POA: Insufficient documentation

## 2015-08-24 DIAGNOSIS — R531 Weakness: Secondary | ICD-10-CM | POA: Insufficient documentation

## 2015-08-24 DIAGNOSIS — R471 Dysarthria and anarthria: Secondary | ICD-10-CM

## 2015-08-24 DIAGNOSIS — G20 Parkinson's disease: Secondary | ICD-10-CM | POA: Insufficient documentation

## 2015-08-24 DIAGNOSIS — G231 Progressive supranuclear ophthalmoplegia [Steele-Richardson-Olszewski]: Secondary | ICD-10-CM

## 2015-08-24 DIAGNOSIS — Z853 Personal history of malignant neoplasm of breast: Secondary | ICD-10-CM | POA: Insufficient documentation

## 2015-08-24 DIAGNOSIS — I10 Essential (primary) hypertension: Secondary | ICD-10-CM | POA: Insufficient documentation

## 2015-08-24 LAB — COMPREHENSIVE METABOLIC PANEL
ALT: 23 U/L (ref 14–54)
ANION GAP: 8 (ref 5–15)
AST: 28 U/L (ref 15–41)
Albumin: 3.5 g/dL (ref 3.5–5.0)
Alkaline Phosphatase: 70 U/L (ref 38–126)
BUN: 12 mg/dL (ref 6–20)
CALCIUM: 9.1 mg/dL (ref 8.9–10.3)
CO2: 30 mmol/L (ref 22–32)
CREATININE: 0.73 mg/dL (ref 0.44–1.00)
Chloride: 99 mmol/L — ABNORMAL LOW (ref 101–111)
Glucose, Bld: 135 mg/dL — ABNORMAL HIGH (ref 65–99)
Potassium: 4 mmol/L (ref 3.5–5.1)
SODIUM: 137 mmol/L (ref 135–145)
Total Bilirubin: 0.8 mg/dL (ref 0.3–1.2)
Total Protein: 6.4 g/dL — ABNORMAL LOW (ref 6.5–8.1)

## 2015-08-24 LAB — I-STAT CHEM 8, ED
BUN: 14 mg/dL (ref 6–20)
CHLORIDE: 98 mmol/L — AB (ref 101–111)
Calcium, Ion: 1.18 mmol/L (ref 1.13–1.30)
Creatinine, Ser: 0.8 mg/dL (ref 0.44–1.00)
GLUCOSE: 132 mg/dL — AB (ref 65–99)
HCT: 37 % (ref 36.0–46.0)
HEMOGLOBIN: 12.6 g/dL (ref 12.0–15.0)
Potassium: 4 mmol/L (ref 3.5–5.1)
SODIUM: 139 mmol/L (ref 135–145)
TCO2: 28 mmol/L (ref 0–100)

## 2015-08-24 LAB — URINE MICROSCOPIC-ADD ON

## 2015-08-24 LAB — DIFFERENTIAL
Basophils Absolute: 0 10*3/uL (ref 0.0–0.1)
Basophils Relative: 0 %
EOS PCT: 1 %
Eosinophils Absolute: 0.1 10*3/uL (ref 0.0–0.7)
LYMPHS ABS: 1.7 10*3/uL (ref 0.7–4.0)
LYMPHS PCT: 23 %
MONO ABS: 0.6 10*3/uL (ref 0.1–1.0)
MONOS PCT: 8 %
Neutro Abs: 5.2 10*3/uL (ref 1.7–7.7)
Neutrophils Relative %: 68 %

## 2015-08-24 LAB — CBC
HEMATOCRIT: 34.4 % — AB (ref 36.0–46.0)
Hemoglobin: 11 g/dL — ABNORMAL LOW (ref 12.0–15.0)
MCH: 29.9 pg (ref 26.0–34.0)
MCHC: 32 g/dL (ref 30.0–36.0)
MCV: 93.5 fL (ref 78.0–100.0)
PLATELETS: 346 10*3/uL (ref 150–400)
RBC: 3.68 MIL/uL — AB (ref 3.87–5.11)
RDW: 13.4 % (ref 11.5–15.5)
WBC: 7.6 10*3/uL (ref 4.0–10.5)

## 2015-08-24 LAB — RAPID URINE DRUG SCREEN, HOSP PERFORMED
AMPHETAMINES: NOT DETECTED
BENZODIAZEPINES: POSITIVE — AB
Barbiturates: NOT DETECTED
Cocaine: NOT DETECTED
OPIATES: NOT DETECTED
TETRAHYDROCANNABINOL: NOT DETECTED

## 2015-08-24 LAB — URINALYSIS, ROUTINE W REFLEX MICROSCOPIC
Bilirubin Urine: NEGATIVE
Glucose, UA: NEGATIVE mg/dL
HGB URINE DIPSTICK: NEGATIVE
Ketones, ur: NEGATIVE mg/dL
Nitrite: NEGATIVE
Protein, ur: NEGATIVE mg/dL
SPECIFIC GRAVITY, URINE: 1.026 (ref 1.005–1.030)
UROBILINOGEN UA: 0.2 mg/dL (ref 0.0–1.0)
pH: 6 (ref 5.0–8.0)

## 2015-08-24 LAB — PROTIME-INR
INR: 1.04 (ref 0.00–1.49)
PROTHROMBIN TIME: 13.8 s (ref 11.6–15.2)

## 2015-08-24 LAB — ETHANOL: Alcohol, Ethyl (B): <5 mg/dL (ref <5)

## 2015-08-24 LAB — I-STAT TROPONIN, ED: TROPONIN I, POC: 0 ng/mL (ref 0.00–0.08)

## 2015-08-24 LAB — APTT: aPTT: 26 seconds (ref 24–37)

## 2015-08-24 NOTE — ED Provider Notes (Signed)
MSE was initiated and I personally evaluated the patient and placed orders (if any) at  3:07 PM on August 24, 2015.  Miranda [name] this emergency department with her husband after an onset of increased slurred speech from baseline and right-sided listing beginning at 1:30 PM. Pt was with her husband when this happened.  Patient's husband reports that she has a history of progressive supranuclear palsy which causes her to have slurred speech at baseline. He reports that the patient has been somewhat confused as she is unable to tell them who the president is.  Patient's friend is at bedside who also reports that she was pale.    Face to face Exam:   General: Awake  HEENT: Atraumatic, eyes closed  Neck: Patent airway without stridor, handling secretions without difficulty Resp: Normal effort, clear and equal breath sounds  Abd: Nondistended, soft and nontender  Neuro: Mental Status:  Alert, oriented to person and place.  Slurred and quiet speech.  Able to follow 2 step commands without difficulty.  Cranial Nerves:  II:  pupils equal, round, reactive to light III,IV, VI: ptosis not present, extra-ocular motions intact bilaterally  V,VII: smile symmetric, facial light touch sensation equal VIII: hearing grossly normal to voice  X: uvula elevates symmetrically  XI: bilateral shoulder shrug symmetric and strong XII: midline tongue extension without fassiculations Motor:  Normal tone. 5/5 in upper extremities bilaterally including dorsiflexion/plantar flexion; 4/5 but equal grip strength at baseline.  No drifting of the upper or lower extremities. Sensory: Light touch normal in all extremities.  CV: distal pulses palpable throughout  Lymph: No adenopathy   3:10 PM Pt discussed with Dr. Janann Colonel of neurology who will evaluate at this time.  Pt to CT urgently.  Airway is intact at this time  Pt discussed with Alecia Lemming, PA-C who will assume care.    The patient appears stable so that the  remainder of the MSE may be completed by another provider.  Jarrett Soho Canna Nickelson, PA-C 08/24/15 [place] Yao, MD 08/24/15 [phone]

## 2015-08-24 NOTE — Consult Note (Signed)
Stroke Consult    Chief Complaint: slurred speech HPI: Miranda [name] is an 69 y.o. female hx of PSP presenting with acute onset of slurred speech and decreased responsiveness. Husband reports this often happens when she gets her Sinemet but today seemed more pronounced. He notes she is less talkative with worse dysarthria, question of increased weakness on the right side.   While in the ED her husband reports her symptoms improved and he feels like she is back to her baseline.   Date last known well: 10/23 Time last known well: 1330 tPA Given:no, symptoms resolved, not typical for stroke Modified Rankin: Rankin Score=4  Past Medical History  Diagnosis Date  . Breast cancer ([place]) 1998    lumpectomy  . Parkinson's disease ([place]) 2012  . Depression   . Hypertension   . Progressive supranuclear palsy ([place])   . Multiple falls     Past Surgical History  Procedure Laterality Date  . Lumpectomy right Right 1998    stage IA, DCIS with micrinvasion, ER+/PR+, , right breast lower inner quadrant  . Cesarean section  1975  . Anterior release vertebral body w/ posterior fusion  2010    L1-S1  . Spinal fusion      Family History  Problem Relation Age of Onset  . Stroke Father   . Cancer Sister   . Cancer Son 65    leiomyosarcoma   Social History:  reports that she has never smoked. She has never used smokeless tobacco. She reports that she drinks alcohol. She reports that she does not use illicit drugs.  Allergies:  Allergies  Allergen Reactions  . Codeine Other (See Comments)    Hyper  . Demerol [Meperidine] Other (See Comments)    hyper  . Valium [Diazepam] Other (See Comments)    hyper     (Not in a hospital admission)  ROS: Out of a complete 14 system review, the patient complains of only the following symptoms, and all other reviewed systems are negative. +dysarthria  Physical Examination: Filed Vitals:   08/24/15 1530  BP: 119/61  Pulse: 102  Temp:   Resp: 23    Physical Exam  Constitutional: He appears well-developed and well-nourished.  Psych: Affect appropriate to situation Eyes: No scleral injection HENT: No OP obstrucion Head: Normocephalic.  Cardiovascular: Normal rate and regular rhythm.  Respiratory: Effort normal and breath sounds normal.  GI: Soft. Bowel sounds are normal. No distension. There is no tenderness.  Skin: WDI  Neurologic Examination: Mental Status: Lethargic, eyes closed but will open to voice, oriented to name, location. Perseverates. Follows simple commands. Hypophonic speech, mild to moderate dysarthria Cranial Nerves: II: optic discs not  visualized, visual fields grossly normal, pupils equal, round, reactive to light  III,IV, VI: ptosis not present, impaired vertical gaze V,VII: smile symmetric, facial light touch sensation normal bilaterally VIII: hearing normal bilaterally IX,X: gag reflex present XI: trapezius strength/neck flexion strength normal bilaterally XII: tongue strength normal  Motor: Moves all extremities symmetrically and against light resistance. Noted bilateral UE resting tremor Tone and bulk:normal tone throughout; no atrophy noted Sensory: withdrawals to noxious stimuli in all extremities Deep Tendon Reflexes: 2+ and symmetric throughout Plantars: Right: downgoing   Left: downgoing Cerebellar: Unable to test Gait: unable to test  Laboratory Studies:   Basic Metabolic Panel:  Recent Labs Lab 08/24/15 1515  NA 139  K 4.0  CL 98*  GLUCOSE 132*  BUN 14  CREATININE 0.80    Liver Function Tests: No results for  input(s): AST, ALT, ALKPHOS, BILITOT, PROT, ALBUMIN in the last 168 hours. No results for input(s): LIPASE, AMYLASE in the last 168 hours. No results for input(s): AMMONIA in the last 168 hours.  CBC:  Recent Labs Lab 08/24/15 1515 08/24/15 1517  WBC  --  7.6  NEUTROABS  --  5.2  HGB 12.6 11.0*  HCT 37.0 34.4*  MCV  --  93.5  PLT  --  346    Cardiac  Enzymes: No results for input(s): CKTOTAL, CKMB, CKMBINDEX, TROPONINI in the last 168 hours.  BNP: Invalid input(s): POCBNP  CBG: No results for input(s): GLUCAP in the last 168 hours.  Microbiology: No results found for this or any previous visit.  Coagulation Studies:  Recent Labs  08/24/15 1517  LABPROT 13.8  INR 1.04    Urinalysis: No results for input(s): COLORURINE, LABSPEC, PHURINE, GLUCOSEU, HGBUR, BILIRUBINUR, KETONESUR, PROTEINUR, UROBILINOGEN, NITRITE, LEUKOCYTESUR in the last 168 hours.  Invalid input(s): APPERANCEUR  Lipid Panel:  No results found for: CHOL, TRIG, HDL, CHOLHDL, VLDL, LDLCALC  HgbA1C: No results found for: HGBA1C  Urine Drug Screen:  No results found for: LABOPIA, COCAINSCRNUR, LABBENZ, AMPHETMU, THCU, LABBARB  Alcohol Level: No results for input(s): ETH in the last 168 hours.  Other results:  Imaging: Ct Head Wo Contrast  08/24/2015  CLINICAL DATA:  Slurred speech. EXAM: CT HEAD WITHOUT CONTRAST TECHNIQUE: Contiguous axial images were obtained from the base of the skull through the vertex without intravenous contrast. COMPARISON:  CT scan of Mar 25, 2014. FINDINGS: Mild diffuse cortical atrophy is noted. No mass effect or midline shift is noted. Ventricular size is within normal limits. There is no evidence of mass lesion, hemorrhage or acute infarction. Bony calvarium appears intact. IMPRESSION: Mild diffuse cortical atrophy. No acute intracranial abnormality seen. These results were called by telephone at the time of interpretation on 08/24/2015 at 3:12 pm to Dr. Janann Colonel, who verbally acknowledged these results. Electronically Signed   By: Marijo Conception, M.D.   On: 08/24/2015 15:15    Assessment: 69 y.o. female hx of PSP presenting with increased lethargy, dysarthria and question of right sided weakness associated with recent dose of Sinemet. Currently at her baseline per family. Presentation and history not typical for TIA/CVA. Suspect  progression of PSP vs possible underlying infection. Patient is followed by neurologist at [facility] and [place]. Has appointment in 2 weeks.   Per discussion with family, will hold on further stroke workup at this time. Will rule out infectious process, if negative can d/c home with outpatient neurology follow up.   -check CMP, UA, CBC -continue Sinemet at current dose and schedule -follow up with outpatient neurologist   Jim Like, DO [facility] [phone]  If 7pm- 7am, please page neurology on call as listed in [place]. 08/24/2015, 3:43 PM

## 2015-08-24 NOTE — ED Notes (Signed)
Patient transported to X-ray 

## 2015-08-24 NOTE — ED Notes (Signed)
Pt has returned to baseline per spouse.

## 2015-08-24 NOTE — ED Notes (Signed)
Pt from home via [facility] with c/o worsening slurred speech, increased lethargy, paleness, and witnessed to be leaning to the right by family at 1330 today.  Hx of progressive supranuclear palsy.  Family at bedside, [place] PA at bedside.  Alert and oriented to baseline.

## 2015-08-24 NOTE — ED Provider Notes (Signed)
CSN: [id number]     Arrival date & time 08/24/15  1451 History   First MD Initiated Contact with Patient 08/24/15 1500     Chief Complaint  Patient presents with  . Aphasia     (Consider location/radiation/quality/duration/timing/severity/associated sxs/prior Treatment) HPI Comments: Patient with history of progressive supranuclear palsy with weakness, balance difficulty, spasticity, speech difficulty at baseline. At approximately 1330, patient was sitting and family noticed that she was leaning to the right side. Her speech was worse than baseline, she was confused, and pale. They were concerned that she might be having a stroke so EMS was called. Patient is not back to her baseline per family. No other symptoms. No other treatments prior to arrival. Onset of symptoms acute. Course is resolved.  The history is provided by the patient.    Past Medical History  Diagnosis Date  . Breast cancer ([place]) 1998    lumpectomy  . Parkinson's disease ([place]) 2012  . Depression   . Hypertension   . Progressive supranuclear palsy ([place])   . Multiple falls    Past Surgical History  Procedure Laterality Date  . Lumpectomy right Right 1998    stage IA, DCIS with micrinvasion, ER+/PR+, , right breast lower inner quadrant  . Cesarean section  1975  . Anterior release vertebral body w/ posterior fusion  2010    L1-S1  . Spinal fusion     Family History  Problem Relation Age of Onset  . Stroke Father   . Cancer Sister   . Cancer Son 72    leiomyosarcoma   Social History  Substance Use Topics  . Smoking status: Never Smoker   . Smokeless tobacco: Never Used  . Alcohol Use: 0.0 oz/week    0 Glasses of wine per week   OB History    No data available     Review of Systems  Constitutional: Negative for fever.  HENT: Negative for rhinorrhea and sore throat.   Eyes: Negative for redness.  Respiratory: Negative for cough.   Cardiovascular: Negative for chest pain.  Gastrointestinal:  Negative for nausea, vomiting, abdominal pain and diarrhea.  Genitourinary: Negative for dysuria.  Musculoskeletal: Negative for myalgias.  Skin: Negative for rash.  Neurological: Positive for speech difficulty and weakness. Negative for headaches.      Allergies  Codeine; Demerol; and Valium  Home Medications   Prior to Admission medications   Medication Sig Start Date End Date Taking? Authorizing Provider  alendronate (FOSAMAX) 70 MG tablet Take 70 mg by mouth every 7 (seven) days. Take with a full glass of water on an empty stomach.    Historical Provider, MD  amantadine (SYMMETREL) 100 MG capsule Take 100 mg by mouth 2 (two) times daily.    Historical Provider, MD  buPROPion (WELLBUTRIN XL) 300 MG 24 hr tablet Take 300 mg by mouth every morning.     Historical Provider, MD  Calcium Citrate (CITRACAL PO) Take 630 mg by mouth every morning.     Historical Provider, MD  carbidopa-levodopa (SINEMET IR) 25-100 MG per tablet Take 1 tablet by mouth 3 (three) times daily.     Historical Provider, MD  carboxymethylcellulose (REFRESH PLUS) 0.5 % SOLN Place 1 drop into both eyes every 2 (two) hours as needed (dry eyes).    Historical Provider, MD  docusate sodium (COLACE) 100 MG capsule Take 1 capsule (100 mg total) by mouth every 12 (twelve) hours. Patient not taking: Reported on 04/12/2015 12/31/14   Wil[place], DO  furosemide (LASIX) 20 MG tablet Take 20 mg by mouth every morning.  11/06/14   Historical Provider, MD  HYDROcodone-acetaminophen (NORCO) 10-325 MG per tablet Take 1 tablet by mouth every 6 (six) hours as needed for moderate pain.  11/06/14   Historical Provider, MD  LORazepam (ATIVAN) 0.5 MG tablet Take 0.5 mg by mouth every 8 (eight) hours as needed for anxiety.  12/22/14   Historical Provider, MD  Melatonin 1 MG TABS Take 1 mg by mouth at bedtime.    Historical Provider, MD  Multiple Vitamins-Minerals (CENTRUM SILVER PO) Take 1 tablet by mouth daily.    Historical Provider, MD   oxyCODONE-acetaminophen (PERCOCET/ROXICET) 5-325 MG per tablet Take 1-2 tablets by mouth every 6 (six) hours as needed. Patient not taking: Reported on 04/12/2015 12/31/14   Delice Bison Ward, DO  PARoxetine (PAXIL) 20 MG tablet Take 20 mg by mouth at bedtime.  01/07/14   Historical Provider, MD  polyethylene glycol (MIRALAX / GLYCOLAX) packet Take 17 g by mouth daily as needed for moderate constipation.     Historical Provider, MD  valsartan (DIOVAN) 160 MG tablet Take 160 mg by mouth every morning.     Historical Provider, MD  zolpidem (AMBIEN) 10 MG tablet Take 1 tablet by mouth at bedtime.  08/28/13   Historical Provider, MD   BP 118/58 mmHg  Pulse 100  Temp(Src) 98.9 F (37.2 C) (Oral)  SpO2 100%   Physical Exam  Constitutional: She is oriented to person, place, and time. She appears well-developed and well-nourished. She appears lethargic.  HENT:  Head: Normocephalic and atraumatic.  Right Ear: Tympanic membrane, external ear and ear canal normal.  Left Ear: Tympanic membrane, external ear and ear canal normal.  Nose: Nose normal.  Mouth/Throat: Uvula is midline, oropharynx is clear and moist and mucous membranes are normal.  Eyes: Conjunctivae, EOM and lids are normal. Pupils are equal, round, and reactive to light. Right eye exhibits no nystagmus. Left eye exhibits no nystagmus.  Neck: Normal range of motion. Neck supple.  Cardiovascular: Normal rate and regular rhythm.   No murmur heard. Pulmonary/Chest: Effort normal and breath sounds normal. No respiratory distress. She has no wheezes. She has no rales.  Abdominal: Soft. There is no tenderness.  Musculoskeletal:       Cervical back: She exhibits normal range of motion, no tenderness and no bony tenderness.  Neurological: She is oriented to person, place, and time. She has normal strength and normal reflexes. She appears lethargic. No cranial nerve deficit or sensory deficit. She displays a negative Romberg sign. Coordination and  gait normal. GCS eye subscore is 4. GCS verbal subscore is 5. GCS motor subscore is 6.  Lethargic, eyes closed but will open to voice. Follows simple commands. Mild dysarthria.  Skin: Skin is warm and dry.  Psychiatric: She has a normal mood and affect.  Nursing note and vitals reviewed.   ED Course  Procedures (including critical care time) Labs Review Labs Reviewed  CBC - Abnormal; Notable for the following:    RBC 3.68 (*)    Hemoglobin 11.0 (*)    HCT 34.4 (*)    All other components within normal limits  COMPREHENSIVE METABOLIC PANEL - Abnormal; Notable for the following:    Chloride 99 (*)    Glucose, Bld 135 (*)    Total Protein 6.4 (*)    All other components within normal limits  URINE RAPID DRUG SCREEN, HOSP PERFORMED - Abnormal; Notable for the following:  Benzodiazepines POSITIVE (*)    All other components within normal limits  URINALYSIS, ROUTINE W REFLEX MICROSCOPIC (NOT AT [facility]) - Abnormal; Notable for the following:    APPearance CLOUDY (*)    Leukocytes, UA SMALL (*)    All other components within normal limits  URINE MICROSCOPIC-ADD ON - Abnormal; Notable for the following:    Crystals CA OXALATE CRYSTALS (*)    All other components within normal limits  I-STAT CHEM 8, ED - Abnormal; Notable for the following:    Chloride 98 (*)    Glucose, Bld 132 (*)    All other components within normal limits  PROTIME-INR  APTT  DIFFERENTIAL  ETHANOL  I-STAT TROPOININ, ED  CBG MONITORING, ED  I-STAT CHEM 8, ED  I-STAT TROPOININ, ED    Imaging Review Dg Chest 2 View  08/24/2015  CLINICAL DATA:  Increasing right-sided weakness and difficulty speaking EXAM: CHEST - 2 VIEW COMPARISON:  12/31/2014 FINDINGS: Cardiac shadow is within normal limits. The lungs are well aerated bilaterally. Multiple old bilateral rib fractures are noted with healing. Postsurgical changes are noted in the lumbar spine. No focal infiltrate or sizable effusion is seen. IMPRESSION: No acute  abnormality noted. Electronically Signed   By: Inez Catalina M.D.   On: 08/24/2015 15:55   Ct Head Wo Contrast  08/24/2015  CLINICAL DATA:  Slurred speech. EXAM: CT HEAD WITHOUT CONTRAST TECHNIQUE: Contiguous axial images were obtained from the base of the skull through the vertex without intravenous contrast. COMPARISON:  CT scan of Mar 25, 2014. FINDINGS: Mild diffuse cortical atrophy is noted. No mass effect or midline shift is noted. Ventricular size is within normal limits. There is no evidence of mass lesion, hemorrhage or acute infarction. Bony calvarium appears intact. IMPRESSION: Mild diffuse cortical atrophy. No acute intracranial abnormality seen. These results were called by telephone at the time of interpretation on 08/24/2015 at 3:12 pm to Dr. Janann Colonel, who verbally acknowledged these results. Electronically Signed   By: Marijo Conception, M.D.   On: 08/24/2015 15:15   I have personally reviewed and evaluated these images and lab results as part of my medical decision-making.   EKG Interpretation   Date/Time:  Sunday August 24 2015 15:21:42 EDT Ventricular Rate:  100 PR Interval:  167 QRS Duration: 79 QT Interval:  339 QTC Calculation: 437 R Axis:   61 Text Interpretation:  Sinus tachycardia Probable left atrial enlargement  Borderline repolarization abnormality No previous ECGs available Confirmed  by ZACKOWSKI  MD, SCOTT ([id number]) on 08/24/2015 3:27:40 PM       3:05 PM MSE performed prior to my arrival by [place] PA-C. Orders placed. Neuro consulted and will see patient promptly. No code stroke due to no focal symptoms currently.     Vital signs reviewed and are as follows: BP 118/58 mmHg  Pulse 100  Temp(Src) 98.9 F (37.2 C) (Oral)  SpO2 100%  3:29 PM Dr. Janann Colonel has seen. No further work-up needed from neuro standpoint. Will look for infection and check labs. If neg, can likely go home. Patient is back at her baseline now per family.   6:43 PM family updated on  results. Patient is now awake and conversant for me, in contrast to earlier report.   UA, chest x-ray are negative for infection. Labs are reassuring. At this point, given neurology evaluation, patient can be discharged home. She is currently at her baseline. She appears well per family. Patient wants to go home.  Patien/family counseled to return  if they have weakness in their arms or legs, slurred speech, trouble walking or talking, confusion, trouble with their balance, or if they have any other concerns. Patient verbalizes understanding and agrees with plan.    MDM   Final diagnoses:  Weakness   Patient with complicated neuro history presents with transient worsening of lethargy, generalized weakness without focal findings on exam. Symptoms are not consistent with TIA. Patient transported in emergency department. She was seen by neurology. Lab workup undertaken which does not reveal any emergent conditions. No evidence of infection. No other treatments at this time. Patient is fully returned her baseline. She has family to care for her and appropriate follow-up. Return instructions discussed with patient and family at bedside.  No dangerous or life-threatening conditions suspected or identified by history, physical exam, and by work-up. No indications for hospitalization identified.      Carlisle Cater, PA-C 08/24/15 1847  Fredia Sorrow, MD 08/29/15 [phone]

## 2015-08-24 NOTE — Discharge Instructions (Signed)
Please read and follow all provided instructions.  Your diagnoses today include:  1. Weakness    Tests performed today include:  CT of your brain - no acute stroke  Blood counts and electrolytes-normal  Urine tes t- no sign of infection  Chest x-ray - no sign of infection  Vital signs. See below for your results today.   Medications prescribed:   None   Take any prescribed medications only as directed.  Home care instructions:  Follow any educational materials contained in this packet.  BE VERY CAREFUL not to take multiple medicines containing Tylenol (also called acetaminophen). Doing so can lead to an overdose which can damage your liver and cause liver failure and possibly death.   Follow-up instructions: Please follow-up with your primary care provider in the next 3 days for further evaluation of your symptoms.   Return instructions:   Please return to the Emergency Department if you experience worsening symptoms.  Return if you have weakness in your arms or legs, slurred speech, trouble walking or talking, confusion, or trouble with your balance.   Please return if you have any other emergent concerns.  Additional Information:  Your vital signs today were: BP 164/78 mmHg   Pulse 95   Temp(Src) 98.9 F (37.2 C) (Oral)   Resp 21   SpO2 99% If your blood pressure (BP) was elevated above 135/85 this visit, please have this repeated by your doctor within one month. --------------

## 2015-09-04 ENCOUNTER — Encounter (HOSPITAL_COMMUNITY): Payer: Self-pay | Admitting: *Deleted

## 2015-09-04 ENCOUNTER — Emergency Department (HOSPITAL_COMMUNITY)
Admission: EM | Admit: 2015-09-04 | Discharge: 2015-09-04 | Disposition: A | Discharge status: Home / Self Care | Payer: Medicare Other | Attending: Emergency Medicine | Admitting: Emergency Medicine

## 2015-09-04 ENCOUNTER — Emergency Department (HOSPITAL_COMMUNITY): Payer: Medicare Other

## 2015-09-04 DIAGNOSIS — Z9181 History of falling: Secondary | ICD-10-CM | POA: Insufficient documentation

## 2015-09-04 DIAGNOSIS — T887XXA Unspecified adverse effect of drug or medicament, initial encounter: Secondary | ICD-10-CM

## 2015-09-04 DIAGNOSIS — I10 Essential (primary) hypertension: Secondary | ICD-10-CM | POA: Diagnosis not present

## 2015-09-04 DIAGNOSIS — F329 Major depressive disorder, single episode, unspecified: Secondary | ICD-10-CM | POA: Diagnosis not present

## 2015-09-04 DIAGNOSIS — R4781 Slurred speech: Secondary | ICD-10-CM | POA: Diagnosis present

## 2015-09-04 DIAGNOSIS — G231 Progressive supranuclear ophthalmoplegia [Steele-Richardson-Olszewski]: Secondary | ICD-10-CM | POA: Diagnosis not present

## 2015-09-04 DIAGNOSIS — Z853 Personal history of malignant neoplasm of breast: Secondary | ICD-10-CM | POA: Diagnosis not present

## 2015-09-04 DIAGNOSIS — G20 Parkinson's disease: Secondary | ICD-10-CM | POA: Diagnosis not present

## 2015-09-04 DIAGNOSIS — Z79899 Other long term (current) drug therapy: Secondary | ICD-10-CM | POA: Insufficient documentation

## 2015-09-04 LAB — RAPID URINE DRUG SCREEN, HOSP PERFORMED
Amphetamines: NOT DETECTED
BARBITURATES: NOT DETECTED
Benzodiazepines: NOT DETECTED
Cocaine: NOT DETECTED
Opiates: NOT DETECTED
TETRAHYDROCANNABINOL: NOT DETECTED

## 2015-09-04 LAB — URINALYSIS, ROUTINE W REFLEX MICROSCOPIC
BILIRUBIN URINE: NEGATIVE
Glucose, UA: NEGATIVE mg/dL
Hgb urine dipstick: NEGATIVE
KETONES UR: NEGATIVE mg/dL
Nitrite: NEGATIVE
PROTEIN: NEGATIVE mg/dL
Specific Gravity, Urine: 1.006 (ref 1.005–1.030)
UROBILINOGEN UA: 0.2 mg/dL (ref 0.0–1.0)
pH: 6.5 (ref 5.0–8.0)

## 2015-09-04 LAB — COMPREHENSIVE METABOLIC PANEL
ALBUMIN: 3.8 g/dL (ref 3.5–5.0)
ALK PHOS: 73 U/L (ref 38–126)
ALT: 8 U/L — ABNORMAL LOW (ref 14–54)
ANION GAP: 7 (ref 5–15)
AST: 26 U/L (ref 15–41)
BILIRUBIN TOTAL: 0.5 mg/dL (ref 0.3–1.2)
BUN: 13 mg/dL (ref 6–20)
CALCIUM: 9.4 mg/dL (ref 8.9–10.3)
CO2: 30 mmol/L (ref 22–32)
Chloride: 97 mmol/L — ABNORMAL LOW (ref 101–111)
Creatinine, Ser: 0.72 mg/dL (ref 0.44–1.00)
GFR calc Af Amer: >60 mL/min (ref >60)
GLUCOSE: 95 mg/dL (ref 65–99)
Potassium: 4 mmol/L (ref 3.5–5.1)
Sodium: 134 mmol/L — ABNORMAL LOW (ref 135–145)
TOTAL PROTEIN: 6.5 g/dL (ref 6.5–8.1)

## 2015-09-04 LAB — I-STAT CHEM 8, ED
BUN: 16 mg/dL (ref 6–20)
CHLORIDE: 96 mmol/L — AB (ref 101–111)
Calcium, Ion: 1.19 mmol/L (ref 1.13–1.30)
Creatinine, Ser: 0.7 mg/dL (ref 0.44–1.00)
GLUCOSE: 93 mg/dL (ref 65–99)
HEMATOCRIT: 36 % (ref 36.0–46.0)
HEMOGLOBIN: 12.2 g/dL (ref 12.0–15.0)
POTASSIUM: 4.1 mmol/L (ref 3.5–5.1)
SODIUM: 135 mmol/L (ref 135–145)
TCO2: 28 mmol/L (ref 0–100)

## 2015-09-04 LAB — DIFFERENTIAL
BASOS ABS: 0.1 10*3/uL (ref 0.0–0.1)
Basophils Relative: 1 %
EOS PCT: 1 %
Eosinophils Absolute: 0.1 10*3/uL (ref 0.0–0.7)
Lymphocytes Relative: 27 %
Lymphs Abs: 1.5 10*3/uL (ref 0.7–4.0)
Monocytes Absolute: 0.5 10*3/uL (ref 0.1–1.0)
Monocytes Relative: 8 %
NEUTROS ABS: 3.5 10*3/uL (ref 1.7–7.7)
Neutrophils Relative %: 63 %

## 2015-09-04 LAB — URINE MICROSCOPIC-ADD ON

## 2015-09-04 LAB — CBC
HCT: 35.6 % — ABNORMAL LOW (ref 36.0–46.0)
Hemoglobin: 11.4 g/dL — ABNORMAL LOW (ref 12.0–15.0)
MCH: 30 pg (ref 26.0–34.0)
MCHC: 32 g/dL (ref 30.0–36.0)
MCV: 93.7 fL (ref 78.0–100.0)
PLATELETS: 364 10*3/uL (ref 150–400)
RBC: 3.8 MIL/uL — ABNORMAL LOW (ref 3.87–5.11)
RDW: 13.6 % (ref 11.5–15.5)
WBC: 5.5 10*3/uL (ref 4.0–10.5)

## 2015-09-04 LAB — I-STAT ARTERIAL BLOOD GAS, ED
Acid-Base Excess: 4 mmol/L — ABNORMAL HIGH (ref 0.0–2.0)
Bicarbonate: 29.7 mEq/L — ABNORMAL HIGH (ref 20.0–24.0)
O2 Saturation: 96 %
PH ART: 7.411 (ref 7.350–7.450)
TCO2: 31 mmol/L (ref 0–100)
pCO2 arterial: 46.8 mmHg — ABNORMAL HIGH (ref 35.0–45.0)
pO2, Arterial: 83 mmHg (ref 80.0–100.0)

## 2015-09-04 LAB — PROTIME-INR
INR: 1.04 (ref 0.00–1.49)
PROTHROMBIN TIME: 13.8 s (ref 11.6–15.2)

## 2015-09-04 LAB — I-STAT TROPONIN, ED: Troponin i, poc: 0.01 ng/mL (ref 0.00–0.08)

## 2015-09-04 LAB — ETHANOL

## 2015-09-04 LAB — APTT: APTT: 28 s (ref 24–37)

## 2015-09-04 MED ORDER — DIPHENHYDRAMINE HCL 50 MG/ML IJ SOLN: 25.0000 mg | Freq: Once | INTRAMUSCULAR | Status: DC

## 2015-09-04 NOTE — Discharge Instructions (Signed)
Progressive Supranuclear Palsy Decrease your sinemet to 1/2 tablet. Follow up with [facility] on Monday as scheduled. Return to the ED if you develop new or worsening symptoms. Progressive supranuclear palsy is a rare brain disorder that causes a gradual deterioration of cells at the base of the brain. It causes serious problems with walking, eye movement, and balance. It may also cause behavior changes, depression, and loss of mental capacity (dementia).  Progressive supranuclear palsy tends to get worse over time.  CAUSES The exact cause of progressive supranuclear palsy is not known. In some cases, the condition may be caused by genes passed down through families (inherited). RISK FACTORS Progressive supranuclear palsy usually begins after age 60 and is slightly more common in men. SIGNS AND SYMPTOMS  The pattern of signs and symptoms can vary greatly from person to person. Signs and symptoms may include:  Loss of balance while walking.  Walking that is stiff and awkward.  Unexplained falls.  Inability to focus the eyes properly or blurred vision.  Changes in mood and behavior. These include:  Lack of feeling or emotion (apathy).  Irritability.  Sudden laughing or crying.  Personality changes.  Progressive mild dementia.  Difficulty swallowing and speaking. DIAGNOSIS  Your health care provider can diagnose progressive supranuclear palsy from your signs and symptoms. The main symptoms your health care provider will check for are:  Poor balance.  Vision problems.  Slurred speech.  Mental or behavioral changes. Your health care provider may also do some tests to rule out other causes of your symptoms. This may include checking your spinal fluid for abnormal proteins and taking images of your brain to look for areas of nerve cell loss. TREATMENT There is no cure for progressive supranuclear palsy. No treatment will slow the progression of the disease. Your treatment will  be based on your symptoms. Treatment may include:  Medicines for Parkinson's disease. These may help with slowness, stiffness, and balance problems.  Antidepressant medicines to treat depression.  Glasses called prisms to help with blurry vision.  Walking aids to prevent falls.  A surgical procedure to place a feeding tube into your stomach (gastrostomy) if swallowing becomes very hard. HOME CARE INSTRUCTIONS Work closely with your team of health care providers. Your team may include:  A physical therapist. Have him or her help you come up with a safe exercise program.  A speech and language therapist. Have him or her teach you ways to swallow foods and liquids safely. This specialist can also help you speak more clearly.  An occupational therapist. Have him or her help you learn to use walking aids and make your home safe. SEEK MEDICAL CARE IF:  You have chills or fever.  Your symptoms are getting worse.  You develop new symptoms.  You are having trouble getting enough nutrition.  You have a cough that will not go away.  You have shortness of breath.  You are anxious or depressed.  You are not getting enough support at home. SEEK IMMEDIATE MEDICAL CARE IF:  You choke, cough, or have trouble breathing after eating or drinking.  You hurt yourself in a fall.  You no longer feel safe at home.   This information is not intended to replace advice given to you by your health care provider. Make sure you discuss any questions you have with your health care provider.   Document Released: 10/08/2002 Document Revised: 11/08/2014 Document Reviewed: 10/17/2013 Elsevier Interactive Patient Education [employer].

## 2015-09-04 NOTE — ED Provider Notes (Signed)
Patient rechecked prior to discharge. She is hemodynamically stable and feeling better.  Nat Christen, MD 09/04/15 [phone]

## 2015-09-04 NOTE — ED Provider Notes (Signed)
CSN: [id number]     Arrival date & time 09/04/15  1406 History   First MD Initiated Contact with Patient 09/04/15 1427     Chief Complaint  Patient presents with  . Code Stroke    @EDPCLEARED @ (Consider location/radiation/quality/duration/timing/severity/associated sxs/prior Treatment) HPI Comments: Patient brought in by husband with confusion and increased slurred speech. Last seen normal at 1 PM. Patient with history of progressive supranuclear palsy as well as Parkinson's disease. She has difficulty with speech and balance at baseline. Today around 1 PM patient took her Sinemet dose which husband states makes her worse. He noticed that she was having memory problems and problems concentrating and her speech seemed more slurred. No focal weakness, numbness or tingling. Denies any fever, chest pain, shortness of breath or abdominal pain. Code stroke activated in triage.  Notably patient with similar presentation on October 23. Episode was less severe. At that time neurology felt this was worsening of her chronic progressive supranuclear palsy.  The history is provided by a relative and the patient. The history is limited by the condition of the patient.    Past Medical History  Diagnosis Date  . Breast cancer ([place]) 1998    lumpectomy  . Parkinson's disease ([place]) 2012  . Depression   . Hypertension   . Progressive supranuclear palsy ([place])   . Multiple falls    Past Surgical History  Procedure Laterality Date  . Lumpectomy right Right 1998    stage IA, DCIS with micrinvasion, ER+/PR+, , right breast lower inner quadrant  . Cesarean section  1975  . Anterior release vertebral body w/ posterior fusion  2010    L1-S1  . Spinal fusion     Family History  Problem Relation Age of Onset  . Stroke Father   . Cancer Sister   . Cancer Son 13    leiomyosarcoma   Social History  Substance Use Topics  . Smoking status: Never Smoker   . Smokeless tobacco: Never Used  . Alcohol Use:  0.0 oz/week    0 Glasses of wine per week   OB History    No data available     Review of Systems  Unable to perform ROS: Mental status change      Allergies  Amantadines; Codeine; Demerol; and Valium  Home Medications   Prior to Admission medications   Medication Sig Start Date End Date Taking? Authorizing Provider  alendronate (FOSAMAX) 70 MG tablet Take 70 mg by mouth every Monday. Take with a full glass of water on an empty stomach.   Yes Historical Provider, MD  buPROPion (WELLBUTRIN XL) 300 MG 24 hr tablet Take 300 mg by mouth every morning.    Yes Historical Provider, MD  Calcium Citrate (CITRACAL PO) Take 630 mg by mouth every morning.    Yes Historical Provider, MD  calcium citrate-vitamin D (CITRACAL+D) 315-200 MG-UNIT tablet Take 2 tablets by mouth daily.   Yes Historical Provider, MD  carbidopa-levodopa (SINEMET IR) 25-100 MG per tablet Take 1 tablet by mouth 3 (three) times daily.    Yes Historical Provider, MD  Coenzyme Q10 (EQL COQ10) 300 MG CAPS Take 2,400 mg by mouth daily.   Yes Historical Provider, MD  docusate sodium (COLACE) 100 MG capsule Take 1 capsule (100 mg total) by mouth every 12 (twelve) hours. 12/31/14  Yes Kristen N Ward, DO  HYDROcodone-acetaminophen (NORCO) 10-325 MG per tablet Take 1 tablet by mouth every 6 (six) hours as needed for moderate pain.  11/06/14  Yes  Historical Provider, MD  LORazepam (ATIVAN) 0.5 MG tablet Take 0.5 mg by mouth every 8 (eight) hours as needed for anxiety or sleep.  12/22/14  Yes Historical Provider, MD  Multiple Vitamin (MULTIVITAMIN WITH MINERALS) TABS tablet Take 1 tablet by mouth daily.   Yes Historical Provider, MD  PARoxetine (PAXIL) 20 MG tablet Take 20 mg by mouth at bedtime.  01/07/14  Yes Historical Provider, MD  polyethylene glycol (MIRALAX / GLYCOLAX) packet Take 17 g by mouth at bedtime.    Yes Historical Provider, MD  trihexyphenidyl (ARTANE) 2 MG tablet Take 1 mg by mouth 2 (two) times daily with a meal.   Yes  Historical Provider, MD  valsartan (DIOVAN) 80 MG tablet Take 80 mg by mouth daily.   Yes Historical Provider, MD  zolpidem (AMBIEN) 10 MG tablet Take 1 tablet by mouth at bedtime.  08/28/13  Yes Historical Provider, MD  oxyCODONE-acetaminophen (PERCOCET/ROXICET) 5-325 MG per tablet Take 1-2 tablets by mouth every 6 (six) hours as needed. Patient not taking: Reported on 04/12/2015 12/31/14   Kristen N Ward, DO   BP 157/85 mmHg  Pulse 85  Temp(Src) 97.3 F (36.3 C) (Oral)  Resp 19  SpO2 100% Physical Exam  Constitutional: She is oriented to person, place, and time. She appears well-developed and well-nourished. No distress.  HENT:  Head: Normocephalic and atraumatic.  Mouth/Throat: Oropharynx is clear and moist. No oropharyngeal exudate.  Eyes: Conjunctivae and EOM are normal. Pupils are equal, round, and reactive to light.  Neck: Normal range of motion. Neck supple.  No meningismus.  Cardiovascular: Normal rate, regular rhythm, normal heart sounds and intact distal pulses.   No murmur heard. Pulmonary/Chest: Effort normal and breath sounds normal. No respiratory distress.  Abdominal: Soft. There is no tenderness. There is no rebound and no guarding.  Musculoskeletal: Normal range of motion. She exhibits no edema or tenderness.  Neurological: She is alert and oriented to person, place, and time. No cranial nerve deficit. She exhibits normal muscle tone. Coordination normal.  Patient with difficulty speaking, clenched jaw, slurred words, follows commands, 5/5 strength throughout. No facial droop. Increased tone in neck and face  Skin: Skin is warm.  Psychiatric: She has a normal mood and affect. Her behavior is normal.  Nursing note and vitals reviewed.   ED Course  Procedures (including critical care time) Labs Review Labs Reviewed  CBC - Abnormal; Notable for the following:    RBC 3.80 (*)    Hemoglobin 11.4 (*)    HCT 35.6 (*)    All other components within normal limits   COMPREHENSIVE METABOLIC PANEL - Abnormal; Notable for the following:    Sodium 134 (*)    Chloride 97 (*)    ALT 8 (*)    All other components within normal limits  I-STAT CHEM 8, ED - Abnormal; Notable for the following:    Chloride 96 (*)    All other components within normal limits  I-STAT ARTERIAL BLOOD GAS, ED - Abnormal; Notable for the following:    pCO2 arterial 46.8 (*)    Bicarbonate 29.7 (*)    Acid-Base Excess 4.0 (*)    All other components within normal limits  ETHANOL  PROTIME-INR  APTT  DIFFERENTIAL  URINE RAPID DRUG SCREEN, HOSP PERFORMED  URINALYSIS, ROUTINE W REFLEX MICROSCOPIC (NOT AT [facility])  I-STAT TROPOININ, ED  CBG MONITORING, ED    Imaging Review Ct Head Wo Contrast  09/04/2015  CLINICAL DATA:  LEFT SIDE WEAKNESS AND LEFT FACIAL  DROOP EXAM: CT HEAD WITHOUT CONTRAST TECHNIQUE: Contiguous axial images were obtained from the base of the skull through the vertex without intravenous contrast. COMPARISON:  08/24/2015, 03/25/2014 FINDINGS: There is mild central and cortical atrophy. Chronic subcortical white-matter ischemic infarcts are identified in the left parietal lobe and right insula. There is no intra or extra-axial fluid collection or mass lesion. The basilar cisterns and ventricles have a normal appearance. There is no CT evidence for acute infarction or hemorrhage. Bone windows are unremarkable. IMPRESSION: 1.  No evidence for acute intracranial abnormality. 2. Mild atrophy. 3. Chronic white matter ischemic infarcts. 4. Critical Value/emergent results were called by telephone at the time of interpretation on 09/04/2015 at 2:53 pm to Dr. Leonel Ramsay, who verbally acknowledged these results. Electronically Signed   By: Nolon Nations M.D.   On: 09/04/2015 14:53   I have personally reviewed and evaluated these images and lab results as part of my medical decision-making.   EKG Interpretation   Date/Time:  Thursday September 04 2015 14:17:45 EDT Ventricular  Rate:  99 PR Interval:  146 QRS Duration: 70 QT Interval:  334 QTC Calculation: 428 R Axis:   61 Text Interpretation:  Normal sinus rhythm ST \\T \ T wave abnormality,  consider inferior ischemia Abnormal ECG Nonspecific ST abnormality  Confirmed by [place] [phone]) on 09/04/2015 2:56:52 PM      MDM   Final diagnoses:  Progressive supranuclear palsy ([place])   code stroke called in triage for increased confusion and slurred speech. Last seen normal at 1 PM. Patient with similar reaction 10 days ago after dose of Sinemet. She did take Sinemet again today.  Neurological exam is nonfocal. She has difficulty speaking mainly because she is not moving her mouth very well. CT head is negative for acute pathology. Labs unremarkable.  Seen by Dr Leonel Ramsay.  He doubts stroke.  He feels this is likely dystonic reaction secondary to sinemet.  Recommends benadryl and monitoring for several hours. Patient care was transferred to Dr. Lacinda Axon at shift change. Plan is to discharge after several hour period of observation. When husband feels she is back to baseline she can go home with follow-up with neurology at [facility] as scheduled.  UA and CXR pending.   Ezequiel Essex, MD 09/04/15 [phone]

## 2015-09-04 NOTE — Consult Note (Signed)
NEURO HOSPITALIST CONSULT NOTE   Referring physician: ED MD   Reason for Consult: Code stroke  LSN 1300 09/04/2015 tPA not given due to symptoms not indicative of stroke  HPI:                                                                                                                                          Miranda [name] is an 69 y.o. female with known PSP whop was recently in ED on 10/23 for same episode. At that time she had just taken her sinemet and then had noted difficulty with speech.  Per husband the symptoms resolved within 2 hours. Husband does not after she takes the sinemet she often has dystonia to the point her neurologist has Rx Ativan for the dystonia. Today she took her sinemet and then was noted to have increased dystonia, decreased speech and she felt as is "she is not running on all cylinders". Currently she is still having dystonia left>right.   Past Medical History  Diagnosis Date  . Breast cancer ([place]) 1998    lumpectomy  . Parkinson's disease ([place]) 2012  . Depression   . Hypertension   . Progressive supranuclear palsy ([place])   . Multiple falls     Past Surgical History  Procedure Laterality Date  . Lumpectomy right Right 1998    stage IA, DCIS with micrinvasion, ER+/PR+, , right breast lower inner quadrant  . Cesarean section  1975  . Anterior release vertebral body w/ posterior fusion  2010    L1-S1  . Spinal fusion      Family History  Problem Relation Age of Onset  . Stroke Father   . Cancer Sister   . Cancer Son 85    leiomyosarcoma     Social History:  reports that she has never smoked. She has never used smokeless tobacco. She reports that she drinks alcohol. She reports that she does not use illicit drugs.  Allergies  Allergen Reactions  . Amantadines Other (See Comments)    Swelling in feet  . Codeine Other (See Comments)    Insomnia Hyperactivity  . Demerol [Meperidine] Other (See Comments)     Insomnia Hyperactivity  . Valium [Diazepam] Other (See Comments)    Insomnia Hyperactivity    MEDICATIONS:  Current Facility-Administered Medications  Medication Dose Route Frequency Provider Last Rate Last Dose  . diphenhydrAMINE (BENADRYL) injection 25 mg  25 mg Intravenous Once Greta Doom, MD       Current Outpatient Prescriptions  Medication Sig Dispense Refill  . alendronate (FOSAMAX) 70 MG tablet Take 70 mg by mouth every Monday. Take with a full glass of water on an empty stomach.    Marland Kitchen buPROPion (WELLBUTRIN XL) 300 MG 24 hr tablet Take 300 mg by mouth every morning.     . Calcium Citrate (CITRACAL PO) Take 630 mg by mouth every morning.     . calcium citrate-vitamin D (CITRACAL+D) 315-200 MG-UNIT tablet Take 2 tablets by mouth daily.    . carbidopa-levodopa (SINEMET IR) 25-100 MG per tablet Take 1 tablet by mouth 3 (three) times daily.     . Coenzyme Q10 (EQL COQ10) 300 MG CAPS Take 2,400 mg by mouth daily.    Marland Kitchen docusate sodium (COLACE) 100 MG capsule Take 1 capsule (100 mg total) by mouth every 12 (twelve) hours. 60 capsule 0  . HYDROcodone-acetaminophen (NORCO) 10-325 MG per tablet Take 1 tablet by mouth every 6 (six) hours as needed for moderate pain.   0  . LORazepam (ATIVAN) 0.5 MG tablet Take 0.5 mg by mouth every 8 (eight) hours as needed for anxiety or sleep.     . Multiple Vitamin (MULTIVITAMIN WITH MINERALS) TABS tablet Take 1 tablet by mouth daily.    Marland Kitchen PARoxetine (PAXIL) 20 MG tablet Take 20 mg by mouth at bedtime.     . polyethylene glycol (MIRALAX / GLYCOLAX) packet Take 17 g by mouth at bedtime.     . trihexyphenidyl (ARTANE) 2 MG tablet Take 1 mg by mouth 2 (two) times daily with a meal.    . valsartan (DIOVAN) 80 MG tablet Take 80 mg by mouth daily.    Marland Kitchen zolpidem (AMBIEN) 10 MG tablet Take 1 tablet by mouth at bedtime.     Marland Kitchen  oxyCODONE-acetaminophen (PERCOCET/ROXICET) 5-325 MG per tablet Take 1-2 tablets by mouth every 6 (six) hours as needed. (Patient not taking: Reported on 04/12/2015) 30 tablet 0      ROS:                                                                                                                                       History obtained from the patient  General ROS: negative for - chills, fatigue, fever, night sweats, weight gain or weight loss Psychological ROS: negative for - behavioral disorder, hallucinations, memory difficulties, mood swings or suicidal ideation Ophthalmic ROS: negative for - blurry vision, double vision, eye pain or loss of vision ENT ROS: negative for - epistaxis, nasal discharge, oral lesions, sore throat, tinnitus or vertigo Allergy and Immunology ROS: negative for - hives or itchy/watery eyes Hematological and Lymphatic ROS: negative for - bleeding problems, bruising or swollen lymph nodes Endocrine  ROS: negative for - galactorrhea, hair pattern changes, polydipsia/polyuria or temperature intolerance Respiratory ROS: negative for - cough, hemoptysis, shortness of breath or wheezing Cardiovascular ROS: negative for - chest pain, dyspnea on exertion, edema or irregular heartbeat Gastrointestinal ROS: negative for - abdominal pain, diarrhea, hematemesis, nausea/vomiting or stool incontinence Genito-Urinary ROS: negative for - dysuria, hematuria, incontinence or urinary frequency/urgency Musculoskeletal ROS: negative for - joint swelling or muscular weakness Neurological ROS: as noted in HPI Dermatological ROS: negative for rash and skin lesion changes   Blood pressure 150/83, pulse 87, temperature 97.3 F (36.3 C), temperature source Oral, resp. rate 20, SpO2 100 %.   Neurologic Examination:                                                                                                      HEENT-  Normocephalic, no lesions, without obvious abnormality.  Normal  external eye and conjunctiva.  Normal TM's bilaterally.  Normal auditory canals and external ears. Normal external nose, mucus membranes and septum.  Normal pharynx. Cardiovascular- S1, S2 normal, pulses palpable throughout   Lungs- chest clear, no wheezing, rales, normal symmetric air entry Abdomen- normal findings: bowel sounds normal Extremities- no edema Lymph-no adenopathy palpable Musculoskeletal-no joint tenderness, deformity or swelling Skin-warm and dry, no hyperpigmentation, vitiligo, or suspicious lesions  Neurological Examination Mental Status: Alert, oriented, thought content appropriate.  Speech very low and is not clear due to limited motion of her mouth no aphasia.  Able to follow 3 step commands without difficulty. Cranial Nerves: II: Discs flat bilaterally; Visual fields grossly normal, pupils equal, round, reactive to light and accommodation III,IV, VI: ptosis not present, extra-ocular motions intact bilaterally with lateral motion but no vertical motion V,VII: smile symmetric, facial light touch sensation normal bilaterally VIII: hearing normal bilaterally IX,X: uvula rises symmetrically XI: bilateral shoulder shrug XII: midline tongue extension  --masked facies  Motor: Right : Upper extremity   5/5    Left:     Upper extremity   5/5  Lower extremity   5/5     Lower extremity   5/5 Tone initially appeared increased in the shoulders and neck and face.  Sensory: Pinprick and light touch intact throughout, bilaterally Deep Tendon Reflexes: 2+ and symmetric throughout Plantars: Bilateral striatal toes.  Cerebellar: normal finger-to-nose,  Gait: not tested      Lab Results: Basic Metabolic Panel:  Recent Labs Lab 09/04/15 1445  NA 135  K 4.1  CL 96*  GLUCOSE 93  BUN 16  CREATININE 0.70    Liver Function Tests: No results for input(s): AST, ALT, ALKPHOS, BILITOT, PROT, ALBUMIN in the last 168 hours. No results for input(s): LIPASE, AMYLASE in the  last 168 hours. No results for input(s): AMMONIA in the last 168 hours.  CBC:  Recent Labs Lab 09/04/15 1445 09/04/15 1447  WBC  --  5.5  NEUTROABS  --  3.5  HGB 12.2 11.4*  HCT 36.0 35.6*  MCV  --  93.7  PLT  --  364    Cardiac Enzymes: No results for input(s): CKTOTAL, CKMB, CKMBINDEX,  TROPONINI in the last 168 hours.  Lipid Panel: No results for input(s): CHOL, TRIG, HDL, CHOLHDL, VLDL, LDLCALC in the last 168 hours.  CBG: No results for input(s): GLUCAP in the last 168 hours.  Microbiology: No results found for this or any previous visit.  Coagulation Studies: No results for input(s): LABPROT, INR in the last 72 hours.  Imaging: Ct Head Wo Contrast  09/04/2015  CLINICAL DATA:  LEFT SIDE WEAKNESS AND LEFT FACIAL DROOP EXAM: CT HEAD WITHOUT CONTRAST TECHNIQUE: Contiguous axial images were obtained from the base of the skull through the vertex without intravenous contrast. COMPARISON:  08/24/2015, 03/25/2014 FINDINGS: There is mild central and cortical atrophy. Chronic subcortical white-matter ischemic infarcts are identified in the left parietal lobe and right insula. There is no intra or extra-axial fluid collection or mass lesion. The basilar cisterns and ventricles have a normal appearance. There is no CT evidence for acute infarction or hemorrhage. Bone windows are unremarkable. IMPRESSION: 1.  No evidence for acute intracranial abnormality. 2. Mild atrophy. 3. Chronic white matter ischemic infarcts. 4. Critical Value/emergent results were called by telephone at the time of interpretation on 09/04/2015 at 2:53 pm to Dr. Leonel Ramsay, who verbally acknowledged these results. Electronically Signed   By: Nolon Nations M.D.   On: 09/04/2015 14:53       Assessment and plan per attending neurologist  Etta Quill PA-C [facility] [phone]  09/04/2015, 3:00 PM  Assessment/Plan: 69 yo F with PSP on sinemet which gives some relief of rigidity, but causes  side effects as well. She presents today with some confusion and possibly abnormal increased tone following a dose of sinemet earlier today. This is her second time with similar symptoms, both after doses of sinemet. She is already improving. She had no actual LOC.   I suspect that this is a medication reaction. I discussed with the husband that it may be reasonable to decrease her sinemet to 1/2 tablet to see if she is able to better tolerate this.   As long as she improves to a point where her husband is comfortable taking her home, I think that discharge with follow up with a PCP specialist on Monday with a decreased dose of sinemet in teh mean time is reasonable.   Roland Rack, MD [facility] [phone]  If 7pm- 7am, please page neurology on call as listed in [place].

## 2015-09-04 NOTE — ED Notes (Signed)
Patient brought in by her husband for confusion and increased slurred speech. Dr Wyvonnia Dusky to Triage room to evaluated patient. Patient was brought to triage without husband and triage RN unable to gain full story and LSN without husband. EDP had examined patient and code stroke called LSN 1300. Strength equal, no facial droop noted.

## 2015-09-04 NOTE — Code Documentation (Signed)
69yo female arriving to [facility] via private vehicle at 1406.  Patient's husband reports that he noticed patient to have confusion and increased slurred speech and that she was last at her baseline at 1300.  Of note, patient with h/o Parkinson's disease on Sinemet.  Code stroke called on patient arrival to the ED.  Stroke team to the bedside.  Patient to CT.  Labs drawn in CT.  NIHSS 2, see documentation for details and code stroke times.  No focal deficits on exam.  Patient very soft spoken and difficult to understand but speech is appropriate and patient able to name and repeat.  Patient with minimal facial movement which patient's husband states occurs after taking her medications.  Dr. Leonel Ramsay at the bedside.  No acute stroke treatment at this time.  Code stroke canceled at 1446.  Bedside handoff with ED RN Suezanne Jacquet.

## 2015-09-23 ENCOUNTER — Ambulatory Visit: Payer: Medicare Other | Attending: Internal Medicine | Admitting: Physical Therapy

## 2015-09-23 ENCOUNTER — Ambulatory Visit: Payer: Medicare Other

## 2015-09-23 ENCOUNTER — Ambulatory Visit: Payer: Medicare Other | Admitting: Occupational Therapy

## 2015-09-23 DIAGNOSIS — R269 Unspecified abnormalities of gait and mobility: Secondary | ICD-10-CM

## 2015-09-23 DIAGNOSIS — R279 Unspecified lack of coordination: Secondary | ICD-10-CM | POA: Insufficient documentation

## 2015-09-23 NOTE — Therapy (Signed)
[facility] [address] [place], [place], [address] Phone: [phone]   Fax:  [phone]  Patient Details  Name: Miranda [name] MRN: [id number] Date of Birth: 1946/09/29 Referring Provider:  Day, Jacqlyn Krauss, MD  Encounter Date: 09/23/2015  Physical Therapy Parkinson's Disease Screen   Timed Up and Go test:Unable to complete due to safety concerns with gait.  10 meter walk test:Unable to complete due to safety concerns with gait.  5 time sit to stand test:Unable to complete due to pt requiring max assist for safe transfer.  Patient would benefit from Physical Therapy evaluation due to increased difficulty with transfer safety.  Pt has strong posterior lean  during transfers.  Husband c/o increased rigidity/stiffness in trunk during transfers and mobility.    Pt has 3 falls this day, from the chairs that she is sitting in, per husband report.  Yacqub Baston W. 09/23/2015, 1:53 PM  Frazier Butt., PT  [facility] [address] [place], [place], [address] Phone: [phone]   Fax:  [phone]

## 2015-09-23 NOTE — Therapy (Signed)
[facility] [address] [place], [place], [address] Phone: [phone]   Fax:  [phone]  Patient Details  Name: Ymelda Samonte MRN: [id number] Date of Birth: Nov 30, 1945 Referring Provider:  Day, Jacqlyn Krauss, MD  Encounter Date: 09/23/2015  Occupational Therapy Parkinson's Disease Screen   Change in ability to perform ADLs/IADLs:  Pt/husband report difficulty getting items from pockets, difficulty brushing teeth, difficulty putting rollers in her hair herself, eating too fast, difficulty brushing hair.  Husband reports freezing with ADLs (brushing hair).  Other Comments:  Pt reports that pt was doing well until approx 1 month ago.  Had 2 episodes of stroke-like symptoms, but reduced Sinemet (with MD recommendation) and symptoms haven't returned.  Pt continues to go to [facility] 3 days/week.    Pt would benefit from occupational therapy evaluation due to  Decline in UE functional use/coordination for ADLs, incr difficulty with timing of ADLs, freezing with ADLs    [facility] 09/23/2015, 1:24 PM  [facility] [address] [place], [place], [address] Phone: [phone]   Fax:  [place], OTR/L 09/23/2015 5:04 PM

## 2015-10-20 ENCOUNTER — Encounter (HOSPITAL_COMMUNITY): Payer: Self-pay | Admitting: Emergency Medicine

## 2015-10-20 ENCOUNTER — Emergency Department (HOSPITAL_COMMUNITY): Payer: Medicare Other

## 2015-10-20 ENCOUNTER — Inpatient Hospital Stay (HOSPITAL_COMMUNITY)
Admission: EM | Admit: 2015-10-20 | Discharge: 2015-10-22 | DRG: 086 | Disposition: A | Discharge status: Home Health | Payer: Medicare Other | Attending: General Surgery | Admitting: General Surgery

## 2015-10-20 DIAGNOSIS — R296 Repeated falls: Secondary | ICD-10-CM | POA: Diagnosis present

## 2015-10-20 DIAGNOSIS — Z981 Arthrodesis status: Secondary | ICD-10-CM | POA: Diagnosis not present

## 2015-10-20 DIAGNOSIS — C50311 Malignant neoplasm of lower-inner quadrant of right female breast: Secondary | ICD-10-CM | POA: Diagnosis present

## 2015-10-20 DIAGNOSIS — S069XAA Unspecified intracranial injury with loss of consciousness status unknown, initial encounter: Secondary | ICD-10-CM | POA: Diagnosis present

## 2015-10-20 DIAGNOSIS — G20 Parkinson's disease: Secondary | ICD-10-CM | POA: Diagnosis present

## 2015-10-20 DIAGNOSIS — W1812XA Fall from or off toilet with subsequent striking against object, initial encounter: Secondary | ICD-10-CM | POA: Diagnosis present

## 2015-10-20 DIAGNOSIS — S0101XA Laceration without foreign body of scalp, initial encounter: Secondary | ICD-10-CM | POA: Diagnosis present

## 2015-10-20 DIAGNOSIS — Z809 Family history of malignant neoplasm, unspecified: Secondary | ICD-10-CM

## 2015-10-20 DIAGNOSIS — S12201A Unspecified nondisplaced fracture of third cervical vertebra, initial encounter for closed fracture: Secondary | ICD-10-CM | POA: Diagnosis present

## 2015-10-20 DIAGNOSIS — G231 Progressive supranuclear ophthalmoplegia [Steele-Richardson-Olszewski]: Secondary | ICD-10-CM | POA: Diagnosis present

## 2015-10-20 DIAGNOSIS — T148 Other injury of unspecified body region: Secondary | ICD-10-CM | POA: Diagnosis present

## 2015-10-20 DIAGNOSIS — Z170 Estrogen receptor positive status [ER+]: Secondary | ICD-10-CM | POA: Diagnosis not present

## 2015-10-20 DIAGNOSIS — F329 Major depressive disorder, single episode, unspecified: Secondary | ICD-10-CM | POA: Diagnosis present

## 2015-10-20 DIAGNOSIS — I10 Essential (primary) hypertension: Secondary | ICD-10-CM | POA: Diagnosis present

## 2015-10-20 DIAGNOSIS — S06350A Traumatic hemorrhage of left cerebrum without loss of consciousness, initial encounter: Secondary | ICD-10-CM

## 2015-10-20 DIAGNOSIS — I615 Nontraumatic intracerebral hemorrhage, intraventricular: Secondary | ICD-10-CM

## 2015-10-20 DIAGNOSIS — Z79899 Other long term (current) drug therapy: Secondary | ICD-10-CM

## 2015-10-20 DIAGNOSIS — S12501A Unspecified nondisplaced fracture of sixth cervical vertebra, initial encounter for closed fracture: Secondary | ICD-10-CM | POA: Diagnosis present

## 2015-10-20 DIAGNOSIS — S12601A Unspecified nondisplaced fracture of seventh cervical vertebra, initial encounter for closed fracture: Secondary | ICD-10-CM | POA: Diagnosis present

## 2015-10-20 DIAGNOSIS — Z885 Allergy status to narcotic agent status: Secondary | ICD-10-CM | POA: Diagnosis not present

## 2015-10-20 DIAGNOSIS — Z888 Allergy status to other drugs, medicaments and biological substances status: Secondary | ICD-10-CM

## 2015-10-20 DIAGNOSIS — S129XXA Fracture of neck, unspecified, initial encounter: Secondary | ICD-10-CM

## 2015-10-20 DIAGNOSIS — W19XXXA Unspecified fall, initial encounter: Secondary | ICD-10-CM | POA: Diagnosis present

## 2015-10-20 DIAGNOSIS — IMO0002 Reserved for concepts with insufficient information to code with codable children: Secondary | ICD-10-CM

## 2015-10-20 DIAGNOSIS — S06340A Traumatic hemorrhage of right cerebrum without loss of consciousness, initial encounter: Secondary | ICD-10-CM | POA: Diagnosis present

## 2015-10-20 DIAGNOSIS — S069X9A Unspecified intracranial injury with loss of consciousness of unspecified duration, initial encounter: Secondary | ICD-10-CM | POA: Diagnosis present

## 2015-10-20 DIAGNOSIS — G20A1 Parkinson's disease without dyskinesia, without mention of fluctuations: Secondary | ICD-10-CM | POA: Diagnosis present

## 2015-10-20 LAB — BASIC METABOLIC PANEL
Anion gap: 10 (ref 5–15)
BUN: 17 mg/dL (ref 6–20)
CHLORIDE: 99 mmol/L — AB (ref 101–111)
CO2: 29 mmol/L (ref 22–32)
CREATININE: 0.67 mg/dL (ref 0.44–1.00)
Calcium: 9.2 mg/dL (ref 8.9–10.3)
GFR calc Af Amer: >60 mL/min (ref >60)
GFR calc non Af Amer: >60 mL/min (ref >60)
GLUCOSE: 127 mg/dL — AB (ref 65–99)
POTASSIUM: 4.1 mmol/L (ref 3.5–5.1)
SODIUM: 138 mmol/L (ref 135–145)

## 2015-10-20 LAB — CBC WITH DIFFERENTIAL/PLATELET
Basophils Absolute: 0.1 10*3/uL (ref 0.0–0.1)
Basophils Relative: 1 %
EOS ABS: 0.2 10*3/uL (ref 0.0–0.7)
EOS PCT: 2 %
HCT: 35.6 % — ABNORMAL LOW (ref 36.0–46.0)
HEMOGLOBIN: 11.7 g/dL — AB (ref 12.0–15.0)
LYMPHS ABS: 1.7 10*3/uL (ref 0.7–4.0)
LYMPHS PCT: 17 %
MCH: 30.7 pg (ref 26.0–34.0)
MCHC: 32.9 g/dL (ref 30.0–36.0)
MCV: 93.4 fL (ref 78.0–100.0)
MONOS PCT: 8 %
Monocytes Absolute: 0.8 10*3/uL (ref 0.1–1.0)
NEUTROS PCT: 72 %
Neutro Abs: 7.3 10*3/uL (ref 1.7–7.7)
Platelets: 345 10*3/uL (ref 150–400)
RBC: 3.81 MIL/uL — AB (ref 3.87–5.11)
RDW: 13 % (ref 11.5–15.5)
WBC: 10 10*3/uL (ref 4.0–10.5)

## 2015-10-20 LAB — PROTIME-INR
INR: 1.02 (ref 0.00–1.49)
Prothrombin Time: 13.6 seconds (ref 11.6–15.2)

## 2015-10-20 LAB — MRSA PCR SCREENING: MRSA by PCR: NEGATIVE

## 2015-10-20 MED ORDER — CARBIDOPA-LEVODOPA 25-100 MG PO TABS
0.5000 | ORAL_TABLET | Freq: Three times a day (TID) | ORAL | Status: DC
  Administered 2015-10-21 – 2015-10-22 (×3): 0.5 via ORAL
  Filled 2015-10-20 (×3): qty 1

## 2015-10-20 MED ORDER — CARBIDOPA-LEVODOPA 25-100 MG PO TABS
1.0000 | ORAL_TABLET | Freq: Three times a day (TID) | ORAL | Status: DC
  Administered 2015-10-20 (×2): 0.5 via ORAL
  Filled 2015-10-20 (×2): qty 1

## 2015-10-20 MED ORDER — POLYVINYL ALCOHOL 1.4 % OP SOLN
1.0000 [drp] | Freq: Three times a day (TID) | OPHTHALMIC | Status: DC | PRN
  Filled 2015-10-20: qty 15

## 2015-10-20 MED ORDER — OMEGA-3-ACID ETHYL ESTERS 1 G PO CAPS
1.0000 g | ORAL_CAPSULE | Freq: Every day | ORAL | Status: DC
  Administered 2015-10-21 – 2015-10-22 (×2): 1 g via ORAL
  Filled 2015-10-20 (×2): qty 1

## 2015-10-20 MED ORDER — PAROXETINE HCL 20 MG PO TABS: 20.0000 mg | ORAL_TABLET | Freq: Every day | ORAL | Status: DC

## 2015-10-20 MED ORDER — BUPROPION HCL ER (XL) 150 MG PO TB24
300.0000 mg | ORAL_TABLET | Freq: Every morning | ORAL | Status: DC
  Administered 2015-10-20 – 2015-10-22 (×3): 300 mg via ORAL
  Filled 2015-10-20: qty 2
  Filled 2015-10-20 (×2): qty 1

## 2015-10-20 MED ORDER — LIDOCAINE-EPINEPHRINE 2 %-1:100000 IJ SOLN
20.0000 mL | Freq: Once | INTRAMUSCULAR | Status: AC
  Administered 2015-10-20: 20 mL
  Filled 2015-10-20: qty 1

## 2015-10-20 MED ORDER — MORPHINE SULFATE (PF) 2 MG/ML IV SOLN: 1.0000 mg | INTRAVENOUS | Status: DC | PRN

## 2015-10-20 MED ORDER — LORAZEPAM 0.5 MG PO TABS
0.5000 mg | ORAL_TABLET | Freq: Three times a day (TID) | ORAL | Status: DC | PRN
  Administered 2015-10-21 – 2015-10-22 (×2): 0.5 mg via ORAL
  Filled 2015-10-20 (×2): qty 1

## 2015-10-20 MED ORDER — HYDROGEN PEROXIDE 3 % EX SOLN
CUTANEOUS | Status: AC
  Administered 2015-10-20: 01:00:00
  Filled 2015-10-20: qty 473

## 2015-10-20 MED ORDER — KCL IN DEXTROSE-NACL 20-5-0.45 MEQ/L-%-% IV SOLN
INTRAVENOUS | Status: DC
  Administered 2015-10-20 – 2015-10-21 (×2): via INTRAVENOUS
  Filled 2015-10-20 (×3): qty 1000

## 2015-10-20 MED ORDER — LIDOCAINE HCL 2 % IJ SOLN: 10.0000 mL | Freq: Once | INTRAMUSCULAR | Status: DC

## 2015-10-20 MED ORDER — ADULT MULTIVITAMIN W/MINERALS CH
1.0000 | ORAL_TABLET | Freq: Every day | ORAL | Status: DC
  Administered 2015-10-21 – 2015-10-22 (×2): 1 via ORAL
  Filled 2015-10-20 (×2): qty 1

## 2015-10-20 MED ORDER — HYDROCODONE-ACETAMINOPHEN 10-325 MG PO TABS
1.0000 | ORAL_TABLET | Freq: Four times a day (QID) | ORAL | Status: DC | PRN
  Administered 2015-10-20 – 2015-10-22 (×5): 1 via ORAL
  Filled 2015-10-20 (×5): qty 1

## 2015-10-20 MED ORDER — PAROXETINE HCL 20 MG PO TABS
20.0000 mg | ORAL_TABLET | Freq: Every day | ORAL | Status: DC
Start: 2015-10-21 — End: 2015-10-22
  Administered 2015-10-21 – 2015-10-22 (×2): 20 mg via ORAL
  Filled 2015-10-20 (×2): qty 1

## 2015-10-20 MED ORDER — ONDANSETRON HCL 4 MG/2ML IJ SOLN: 4.0000 mg | Freq: Four times a day (QID) | INTRAMUSCULAR | Status: DC | PRN

## 2015-10-20 MED ORDER — ONDANSETRON HCL 4 MG PO TABS: 4.0000 mg | ORAL_TABLET | Freq: Four times a day (QID) | ORAL | Status: DC | PRN

## 2015-10-20 MED ORDER — ZOLPIDEM TARTRATE 5 MG PO TABS
5.0000 mg | ORAL_TABLET | Freq: Every evening | ORAL | Status: DC | PRN
  Administered 2015-10-20 – 2015-10-21 (×2): 5 mg via ORAL
  Filled 2015-10-20 (×2): qty 1

## 2015-10-20 NOTE — ED Notes (Signed)
[facility] Dr. Lucia Gaskins at bedside.  Will be transferred to [facility].

## 2015-10-20 NOTE — Consult Note (Signed)
Reason for Consult: Closed head injury C-spine fracture Referring Physician: Emergency department Dr. Irena [name] [name] is an 69 y.o. female.  HPI: Patient is a 69 year old female with a history of Parkinson's disease lives at home scheduled for by her husband she fell off the toilet striking her head grating a scalp laceration and a small amount of interventricular hemorrhage in addition she also sustained a cervical spine fracture that was diagnosed on CT of her C-spine. Currently the patient complains of mild headache no significant nausea some neck pain no numbness and tingling her arms or legs.  Past Medical History  Diagnosis Date  . Breast cancer ([place]) 1998    lumpectomy  . Parkinson's disease ([place]) 2012  . Depression   . Hypertension   . 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 Multiple falls     Past Surgical History  Procedure Laterality Date  . Lumpectomy right Right 1998    stage IA, DCIS with micrinvasion, ER+/PR+, , right breast lower inner quadrant  . Cesarean section  1975  . Anterior release vertebral body w/ posterior fusion  2010    L1-S1  . Spinal fusion      Family History  Problem Relation Age of Onset  . Stroke Father   . Cancer Sister   . Cancer Son 53    leiomyosarcoma    Social History:  reports that she has never smoked. She has never used smokeless tobacco. She reports that she drinks alcohol. She reports that she does not use illicit drugs.  Allergies:  Allergies  Allergen Reactions  . Amantadines Other (See Comments)    Swelling in feet  . Codeine Other (See Comments)    Insomnia Hyperactivity  . Demerol [Meperidine] Other (See Comments)    Insomnia Hyperactivity  . Valium [Diazepam] Other (See Comments)    Insomnia Hyperactivity    Medications: I have reviewed the patient's current medications.  Results for orders placed or performed during the hospital encounter of 10/20/15 (from the past 48 hour(s))  CBC with  Differential/Platelet     Status: Abnormal   Collection Time: 10/20/15  3:30 AM  Result Value Ref Range   WBC 10.0 4.0 - 10.5 K/uL   RBC 3.81 (L) 3.87 - 5.11 MIL/uL   Hemoglobin 11.7 (L) 12.0 - 15.0 g/dL   HCT 35.6 (L) 36.0 - 46.0 %   MCV 93.4 78.0 - 100.0 fL   MCH 30.7 26.0 - 34.0 pg   MCHC 32.9 30.0 - 36.0 g/dL   RDW 13.0 11.5 - 15.5 %   Platelets 345 150 - 400 K/uL   Neutrophils Relative % 72 %   Neutro Abs 7.3 1.7 - 7.7 K/uL   Lymphocytes Relative 17 %   Lymphs Abs 1.7 0.7 - 4.0 K/uL   Monocytes Relative 8 %   Monocytes Absolute 0.8 0.1 - 1.0 K/uL   Eosinophils Relative 2 %   Eosinophils Absolute 0.2 0.0 - 0.7 K/uL   Basophils Relative 1 %   Basophils Absolute 0.1 0.0 - 0.1 K/uL  Basic metabolic panel     Status: Abnormal   Collection Time: 10/20/15  3:30 AM  Result Value Ref Range   Sodium 138 135 - 145 mmol/L   Potassium 4.1 3.5 - 5.1 mmol/L   Chloride 99 (L) 101 - 111 mmol/L   CO2 29 22 - 32 mmol/L   Glucose, Bld 127 (H) 65 - 99 mg/dL   BUN 17 6 - 20 mg/dL   Creatinine, Ser  0.67 0.44 - 1.00 mg/dL   Calcium 9.2 8.9 - 10.3 mg/dL   GFR calc non Af Amer >60 >60 mL/min   GFR calc Af Amer >60 >60 mL/min    Comment: (NOTE) The eGFR has been calculated using the CKD EPI equation. This calculation has not been validated in all clinical situations. eGFR's persistently <60 mL/min signify possible Chronic Kidney Disease.    Anion gap 10 5 - 15  Protime-INR     Status: None   Collection Time: 10/20/15  3:30 AM  Result Value Ref Range   Prothrombin Time 13.6 11.6 - 15.2 seconds   INR 1.02 0.00 - 1.49    Ct Head Wo Contrast  10/20/2015  CLINICAL DATA:  Fall from toilet with head injury. Parkinson's and breast cancer history. EXAM: CT HEAD WITHOUT CONTRAST CT CERVICAL SPINE WITHOUT CONTRAST TECHNIQUE: Multidetector CT imaging of the head and cervical spine was performed following the standard protocol without intravenous contrast. Multiplanar CT image reconstructions of the  cervical spine were also generated. COMPARISON:  09/04/2015 head CT FINDINGS: CT HEAD FINDINGS Skull and Sinuses:Deep frontal scalp laceration without fracture. Visualized orbits: Negative. Brain: Small volume high density within the left lateral ventricle, seen at the atrium and temporal horn. No hydrocephalus. No evidence of infarct, brain swelling, or shift. Mild chronic small vessel ischemic change. CT CERVICAL SPINE FINDINGS C7 right articular process and pedicle fracture without displacement or subluxation. C6 right articular process, lamina, and pedicle fracture without displacement. C3 right transverse process fracture without displacement. There is advanced degenerative disc narrowing from C4-5 to C6-7 with endplate and uncovertebral ridging. C4-5 retrolisthesis which is chronic based on 08/19/2013 cervical spine radiography. Slight C3-4 anterolisthesis is likely degenerative, progressed from prior. No gross canal hematoma. Critical Value/emergent results were called by telephone at the time of interpretation on 10/20/2015 at 2:04 am to Dr. Joseph Berkshire , who verbally acknowledged these results. Remote posterior right third rib fracture. IMPRESSION: 1. Small volume acute hemorrhage in the left lateral ventricle. No hydrocephalus. 2. Nondisplaced C7 right articular process and pedicle fracture. 3. Nondisplaced C6 right articular process, lamina, and pedicle fracture. 4. Nondisplaced C3 right transverse process fracture. 5. Frontal scalp laceration without fracture. Electronically Signed   By: Monte Fantasia M.D.   On: 10/20/2015 02:08   Ct Cervical Spine Wo Contrast  10/20/2015  CLINICAL DATA:  Fall from toilet with head injury. Parkinson's and breast cancer history. EXAM: CT HEAD WITHOUT CONTRAST CT CERVICAL SPINE WITHOUT CONTRAST TECHNIQUE: Multidetector CT imaging of the head and cervical spine was performed following the standard protocol without intravenous contrast. Multiplanar CT image  reconstructions of the cervical spine were also generated. COMPARISON:  09/04/2015 head CT FINDINGS: CT HEAD FINDINGS Skull and Sinuses:Deep frontal scalp laceration without fracture. Visualized orbits: Negative. Brain: Small volume high density within the left lateral ventricle, seen at the atrium and temporal horn. No hydrocephalus. No evidence of infarct, brain swelling, or shift. Mild chronic small vessel ischemic change. CT CERVICAL SPINE FINDINGS C7 right articular process and pedicle fracture without displacement or subluxation. C6 right articular process, lamina, and pedicle fracture without displacement. C3 right transverse process fracture without displacement. There is advanced degenerative disc narrowing from C4-5 to C6-7 with endplate and uncovertebral ridging. C4-5 retrolisthesis which is chronic based on 08/19/2013 cervical spine radiography. Slight C3-4 anterolisthesis is likely degenerative, progressed from prior. No gross canal hematoma. Critical Value/emergent results were called by telephone at the time of interpretation on 10/20/2015 at 2:04 am to  Dr. Joseph Berkshire , who verbally acknowledged these results. Remote posterior right third rib fracture. IMPRESSION: 1. Small volume acute hemorrhage in the left lateral ventricle. No hydrocephalus. 2. Nondisplaced C7 right articular process and pedicle fracture. 3. Nondisplaced C6 right articular process, lamina, and pedicle fracture. 4. Nondisplaced C3 right transverse process fracture. 5. Frontal scalp laceration without fracture. Electronically Signed   By: Monte Fantasia M.D.   On: 10/20/2015 02:08    Review of Systems  Constitutional: Negative.   Eyes: Negative.   Respiratory: Negative.   Cardiovascular: Negative.   Gastrointestinal: Negative.   Musculoskeletal: Positive for myalgias and neck pain.  Skin: Negative.   Neurological: Positive for headaches.  Endo/Heme/Allergies: Negative.    Blood pressure 148/80, pulse 100,  temperature 97.7 F (36.5 C), temperature source Oral, resp. rate 16, SpO2 98 %. Physical Exam  Neurological: She has normal strength. GCS eye subscore is 4. GCS verbal subscore is 5. GCS motor subscore is 6.  Is awake and alert oriented 2 pupils are equal extra movements are intact strength is 5 out of 5 in her upper and lower extremities positive neck tenderness    Assessment/Plan: 69 year old female with some small amount of IVH and C6 facet and pedicle fracture C7 facet and pedicle fracture nondisplaced all on the right. Recommend observation repeat CT in the morning, maintain cervical collar 24/7.  Trennon Torbeck P 10/20/2015, 6:56 AM

## 2015-10-20 NOTE — ED Notes (Signed)
Pt has a nuerological disorder per husband. Pt was in the bathroom and fell and hit the wall. Pt is bleeding from an injury on the right side of her head. Husband thought they had the bleeding stopped at the time, but they noticed it started bleeding again. Denies LOC. Pt is able to speak and follow directions. Pt is bleeding in triage.

## 2015-10-20 NOTE — ED Notes (Signed)
Patient transported to CT 

## 2015-10-20 NOTE — ED Provider Notes (Addendum)
CSN: [id number]     Arrival date & time 10/20/15  0025 History  By signing my name below, I, Irene Pap, attest that this documentation has been prepared under the direction and in the presence of Orpah Greek, MD. Electronically Signed: Irene Pap, ED Scribe. 10/20/2015. 2:14 AM.  Chief Complaint  Patient presents with  . Fall  . Head Laceration   The history is provided by the spouse. No language interpreter was used.  HPI Comments: Miranda [name] is a 69 y.o. Female with a hx of Parkinson's Disease, breast cancer, progressive supranuclear palsy and multiple falls who presents to the Emergency Department complaining of a fall onset PTA. Per spouse, pt was sitting on the toilet when she fell forward reaching for something and hit her head on the wall. He states that they were initially able to control the bleeding, but it started bleeding again and is currently uncontrolled. Pt is complaining of headache, neck pain and stiffness. Pt denies extremity pain, back pain, or LOC. Spouse believes that pt is UTD on her tdap.  Past Medical History  Diagnosis Date  . Breast cancer ([place]) 1998    lumpectomy  . Parkinson's disease ([place]) 2012  . Depression   . Hypertension   . 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 Multiple falls    Past Surgical History  Procedure Laterality Date  . Lumpectomy right Right 1998    stage IA, DCIS with micrinvasion, ER+/PR+, , right breast lower inner quadrant  . Cesarean section  1975  . Anterior release vertebral body w/ posterior fusion  2010    L1-S1  . Spinal fusion     Family History  Problem Relation Age of Onset  . Stroke Father   . Cancer Sister   . Cancer Son 52    leiomyosarcoma   Social History  Substance Use Topics  . Smoking status: Never Smoker   . Smokeless tobacco: Never Used  . Alcohol Use: 0.0 oz/week    0 Glasses of wine per week   OB History    No data available     Review of Systems  Musculoskeletal:  Positive for neck pain and neck stiffness. Negative for back pain and arthralgias.  Skin: Positive for wound.  Neurological: Positive for headaches. Negative for syncope.  All other systems reviewed and are negative.   Allergies  Amantadines; Codeine; Demerol; and Valium  Home Medications   Prior to Admission medications   Medication Sig Start Date End Date Taking? Authorizing Provider  alendronate (FOSAMAX) 70 MG tablet Take 70 mg by mouth every Monday. Take with a full glass of water on an empty stomach.    Historical Provider, MD  buPROPion (WELLBUTRIN XL) 300 MG 24 hr tablet Take 300 mg by mouth every morning.     Historical Provider, MD  Calcium Citrate (CITRACAL PO) Take 630 mg by mouth every morning.     Historical Provider, MD  calcium citrate-vitamin D (CITRACAL+D) 315-200 MG-UNIT tablet Take 2 tablets by mouth daily.    Historical Provider, MD  carbidopa-levodopa (SINEMET IR) 25-100 MG per tablet Take 1 tablet by mouth 3 (three) times daily.     Historical Provider, MD  Coenzyme Q10 (EQL COQ10) 300 MG CAPS Take 2,400 mg by mouth daily.    Historical Provider, MD  docusate sodium (COLACE) 100 MG capsule Take 1 capsule (100 mg total) by mouth every 12 (twelve) hours. 12/31/14   Kristen N Ward, DO  HYDROcodone-acetaminophen (NORCO) 10-325 MG per tablet  Take 1 tablet by mouth every 6 (six) hours as needed for moderate pain.  11/06/14   Historical Provider, MD  LORazepam (ATIVAN) 0.5 MG tablet Take 0.5 mg by mouth every 8 (eight) hours as needed for anxiety or sleep.  12/22/14   Historical Provider, MD  Multiple Vitamin (MULTIVITAMIN WITH MINERALS) TABS tablet Take 1 tablet by mouth daily.    Historical Provider, MD  PARoxetine (PAXIL) 20 MG tablet Take 20 mg by mouth at bedtime.  01/07/14   Historical Provider, MD  polyethylene glycol (MIRALAX / GLYCOLAX) packet Take 17 g by mouth at bedtime.     Historical Provider, MD  trihexyphenidyl (ARTANE) 2 MG tablet Take 1 mg by mouth 2 (two) times  daily with a meal.    Historical Provider, MD  valsartan (DIOVAN) 80 MG tablet Take 80 mg by mouth daily.    Historical Provider, MD  zolpidem (AMBIEN) 10 MG tablet Take 1 tablet by mouth at bedtime.  08/28/13   Historical Provider, MD   BP 133/70 mmHg  Pulse 100  Temp(Src) 97.7 F (36.5 C) (Oral)  Resp 18  SpO2 100% Physical Exam  Constitutional: She is oriented to person, place, and time. She appears well-developed and well-nourished. No distress.  HENT:  Head: Normocephalic.  Right Ear: Hearing normal.  Left Ear: Hearing normal.  Nose: Nose normal.  Mouth/Throat: Oropharynx is clear and moist and mucous membranes are normal.  3 cm linear laceration to the vertex scalp  Eyes: Conjunctivae and EOM are normal. Pupils are equal, round, and reactive to light.  Neck: Neck supple.  Pt complains of stiffness/pain in the neck with ROM  Cardiovascular: Regular rhythm, S1 normal and S2 normal.  Exam reveals no gallop and no friction rub.   No murmur heard. Pulmonary/Chest: Effort normal and breath sounds normal. No respiratory distress. She exhibits no tenderness.  Abdominal: Soft. Normal appearance and bowel sounds are normal. There is no hepatosplenomegaly. There is no tenderness. There is no rebound, no guarding, no tenderness at McBurney's point and negative Murphy's sign. No hernia.  Musculoskeletal: Normal range of motion.  Neurological: She is alert and oriented to person, place, and time. She has normal strength. No cranial nerve deficit or sensory deficit. Coordination normal. GCS eye subscore is 4. GCS verbal subscore is 5. GCS motor subscore is 6.  Skin: Skin is warm, dry and intact. No rash noted. No cyanosis.  Psychiatric: She has a normal mood and affect. Her speech is normal and behavior is normal. Thought content normal.  Nursing note and vitals reviewed.   ED Course  Procedures (including critical care time) DIAGNOSTIC STUDIES: Oxygen Saturation is 100% on RA, normal by  my interpretation.    COORDINATION OF CARE: 12:37 AM-Discussed treatment plan which includes laceration repair and CT scan with pt at bedside and pt agreed to plan.   LACERATION REPAIR Performed by: Orpah Greek, MD Consent: Verbal consent obtained. Risks and benefits: risks, benefits and alternatives were discussed Patient identity confirmed: provided demographic data Time out performed prior to procedure Prepped and Draped in normal sterile fashion Wound explored Laceration Location: vertex scalp Laceration Length: 3 cm No Foreign Bodies seen or palpated Anesthesia: local infiltration Local anesthetic: lidocaine 2%  w/epinephrine Anesthetic total: 2 ml Irrigation method: syringe Amount of cleaning: standard Skin closure: buried figure of eight suture, 5-0 Vicryl for bleeding arteriole in scalp followed by staple skin closure, 11 staples placed  Patient tolerance: Patient tolerated the procedure well with no immediate complications.  Labs Review Labs Reviewed  CBC WITH DIFFERENTIAL/PLATELET  BASIC METABOLIC PANEL  PROTIME-INR    Imaging Review Ct Head Wo Contrast  10/20/2015  CLINICAL DATA:  Fall from toilet with head injury. Parkinson's and breast cancer history. EXAM: CT HEAD WITHOUT CONTRAST CT CERVICAL SPINE WITHOUT CONTRAST TECHNIQUE: Multidetector CT imaging of the head and cervical spine was performed following the standard protocol without intravenous contrast. Multiplanar CT image reconstructions of the cervical spine were also generated. COMPARISON:  09/04/2015 head CT FINDINGS: CT HEAD FINDINGS Skull and Sinuses:Deep frontal scalp laceration without fracture. Visualized orbits: Negative. Brain: Small volume high density within the left lateral ventricle, seen at the atrium and temporal horn. No hydrocephalus. No evidence of infarct, brain swelling, or shift. Mild chronic small vessel ischemic change. CT CERVICAL SPINE FINDINGS C7 right articular process and  pedicle fracture without displacement or subluxation. C6 right articular process, lamina, and pedicle fracture without displacement. C3 right transverse process fracture without displacement. There is advanced degenerative disc narrowing from C4-5 to C6-7 with endplate and uncovertebral ridging. C4-5 retrolisthesis which is chronic based on 08/19/2013 cervical spine radiography. Slight C3-4 anterolisthesis is likely degenerative, progressed from prior. No gross canal hematoma. Critical Value/emergent results were called by telephone at the time of interpretation on 10/20/2015 at 2:04 am to Dr. Joseph Berkshire , who verbally acknowledged these results. Remote posterior right third rib fracture. IMPRESSION: 1. Small volume acute hemorrhage in the left lateral ventricle. No hydrocephalus. 2. Nondisplaced C7 right articular process and pedicle fracture. 3. Nondisplaced C6 right articular process, lamina, and pedicle fracture. 4. Nondisplaced C3 right transverse process fracture. 5. Frontal scalp laceration without fracture. Electronically Signed   By: Monte Fantasia M.D.   On: 10/20/2015 02:08   Ct Cervical Spine Wo Contrast  10/20/2015  CLINICAL DATA:  Fall from toilet with head injury. Parkinson's and breast cancer history. EXAM: CT HEAD WITHOUT CONTRAST CT CERVICAL SPINE WITHOUT CONTRAST TECHNIQUE: Multidetector CT imaging of the head and cervical spine was performed following the standard protocol without intravenous contrast. Multiplanar CT image reconstructions of the cervical spine were also generated. COMPARISON:  09/04/2015 head CT FINDINGS: CT HEAD FINDINGS Skull and Sinuses:Deep frontal scalp laceration without fracture. Visualized orbits: Negative. Brain: Small volume high density within the left lateral ventricle, seen at the atrium and temporal horn. No hydrocephalus. No evidence of infarct, brain swelling, or shift. Mild chronic small vessel ischemic change. CT CERVICAL SPINE FINDINGS C7 right  articular process and pedicle fracture without displacement or subluxation. C6 right articular process, lamina, and pedicle fracture without displacement. C3 right transverse process fracture without displacement. There is advanced degenerative disc narrowing from C4-5 to C6-7 with endplate and uncovertebral ridging. C4-5 retrolisthesis which is chronic based on 08/19/2013 cervical spine radiography. Slight C3-4 anterolisthesis is likely degenerative, progressed from prior. No gross canal hematoma. Critical Value/emergent results were called by telephone at the time of interpretation on 10/20/2015 at 2:04 am to Dr. Joseph Berkshire , who verbally acknowledged these results. Remote posterior right third rib fracture. IMPRESSION: 1. Small volume acute hemorrhage in the left lateral ventricle. No hydrocephalus. 2. Nondisplaced C7 right articular process and pedicle fracture. 3. Nondisplaced C6 right articular process, lamina, and pedicle fracture. 4. Nondisplaced C3 right transverse process fracture. 5. Frontal scalp laceration without fracture. Electronically Signed   By: Monte Fantasia M.D.   On: 10/20/2015 02:08   I have personally reviewed and evaluated these images and lab results as part of my medical decision-making.   EKG  Interpretation None      MDM   Final diagnoses:  Laceration  Traumatic hemorrhage of left cerebrum without loss of consciousness, initial encounter [facility])  Multiple fractures of cervical spine, initial encounter ([place])    Presents to the ER for evaluation of head injury after a fall. Patient was sitting on the toilet and fell forward, hitting her head on either of the wall or floor. There was no loss of consciousness. Patient is complaining of headache and neck pain at arrival. She is neurologically intact. CT scan of head and cervical spine were performed. Head CT does show small amount of blood and he left lateral ventricle. She also has evidence of nondisplaced C7 right  articular process and pedicle fracture, C6 right articular process lamina and pedicle fracture, and nondisplaced C3 right transverse process fracture. These appear to be stable. Findings discussed with Dr. Saintclair Halsted, agrees with Aspen collar. Patient will be seen by Dr. Saintclair Halsted in the morning.  CRITICAL CARE Performed by: Orpah Greek   Total critical care time: 30 minutes  Critical care time was exclusive of separately billable procedures and treating other patients.  Critical care was necessary to treat or prevent imminent or life-threatening deterioration.  Critical care was time spent personally by me on the following activities: development of treatment plan with patient and/or surrogate as well as nursing, discussions with consultants, evaluation of patient's response to treatment, examination of patient, obtaining history from patient or surrogate, ordering and performing treatments and interventions, ordering and review of laboratory studies, ordering and review of radiographic studies, pulse oximetry and re-evaluation of patient's condition.   I personally performed the services described in this documentation, which was scribed in my presence. The recorded information has been reviewed and is accurate.     Orpah Greek, MD 10/20/15 [place], MD 10/20/15 [phone]

## 2015-10-20 NOTE — ED Notes (Signed)
[place], MD at bedside stapelling laceration

## 2015-10-20 NOTE — H&P (Addendum)
Re:   Miranda [name] DOB:   14-Apr-1946 MRN:   [id number]   Admission for Trauma  ASSESSMENT AND PLAN: 1.  CHI with hemorrhage to left lateral ventricle  All acute injuries from fall at home around 9:30 PM Sunday night, 12/18.   According to her husband, she is at baseline neurologically.  Dr. Betsey Holiday spoke with Dr. Saintclair Halsted - plan to repeat CT scan head and observation.  Will leave timing of repeat CT scan to Dr. Saintclair Halsted. Will transfer to trauma service at [facility].  I spoke to Dr. Kieth Brightly by phone.  Apparently there are no step down beds, so will admit to ICU for now.   2.  C - spine fracture  Nondisplaced C7 right articular process and pedicle fracture, nondisplaced C6 right articular process, lamina, and pedicle fracture, nondisplaced C3 right transverse process fracture.  Plan per Dr. Saintclair Halsted 3.  Scalp lac  Closed in [facility] ER 4.  Parkinson's disease/Progressive supranuclear palsy    Originally diagnosed 2012  Sees Dr. Tonye Royalty - [facility] and Dr. Posey Rea - at [facility] 6.  History of right breast cancer - 1998 - Stage 1A  Followed by Dr. Lindi Adie 7.  Depression 8.  History of multiple falls  Evidence of multiple old rib fxes on CXR 9.  History of spinal fusion 2010  Chief Complaint  Patient presents with  . Fall  . Head Laceration   REFERRING PHYSICIAN:  Dr. Joseph Berkshire, [facility]  HISTORY OF PRESENT ILLNESS: Miranda [name] is a 69 y.o. (DOB: April 17, 1946)  white  female whose primary care physician is DAY,TEMPLE V, MD and came to [facility] today for fall at home.  Trauma was consulted because of her injuries.  Dr. Betsey Holiday spoke to Dr. Saintclair Halsted who will follow up with the patient. I talked to her husband by phone - [phone].  She has been seen for multiple falls secondary to her underlying neurologic condition. She fell around 9:30 PM, Sunday night, December 18. She was taken to the [facility] emergency room for further evaluation. Because of her underlying Parkinson's  disease/progressive supranuclear palsy she is somewhat difficult to evaluate.  She has had no loss of consciousness.  In discussion with her husband he says she is at baseline. She does respond appropriately, but is difficult to understand.  CT head/neck - 09/20/2015 - 1. Small volume acute hemorrhage in the left lateral ventricle. No hydrocephalus.  2. Nondisplaced C7 right articular process and pedicle fracture.  3. Nondisplaced C6 right articular process, lamina, and pediclefracture.  4. Nondisplaced C3 right transverse process fracture.  5. Frontal scalp laceration without fracture.    Past Medical History  Diagnosis Date  . Breast cancer ([place]) 1998    lumpectomy  . Parkinson's disease ([place]) 2012  . Depression   . Hypertension   . Progressive supranuclear palsy ([place])   . Multiple falls       Past Surgical History  Procedure Laterality Date  . Lumpectomy right Right 1998    stage IA, DCIS with micrinvasion, ER+/PR+, , right breast lower inner quadrant  . Cesarean section  1975  . Anterior release vertebral body w/ posterior fusion  2010    L1-S1  . Spinal fusion        No current facility-administered medications for this encounter.   Current Outpatient Prescriptions  Medication Sig Dispense Refill  . alendronate (FOSAMAX) 70 MG tablet Take 70 mg by mouth every Monday. Take with a full glass of water on an empty  stomach.    . Artificial Tear Ointment (EYE LUBRICANT) OINT Apply 1 application to eye at bedtime.    Marland Kitchen aspirin EC 81 MG tablet Take 81 mg by mouth daily.    Marland Kitchen buPROPion (WELLBUTRIN XL) 300 MG 24 hr tablet Take 300 mg by mouth every morning.     . calcium citrate-vitamin D (CITRACAL+D) 315-200 MG-UNIT tablet Take 2 tablets by mouth daily.    . carbidopa-levodopa (SINEMET IR) 25-100 MG per tablet Take 1 tablet by mouth 3 (three) times daily.     . carboxymethylcellulose (REFRESH PLUS) 0.5 % SOLN Apply 1 drop to eye 3 (three) times daily as needed (for dry eyes).     . Coenzyme Q10 (EQL COQ10) 300 MG CAPS Take 2,400 mg by mouth daily.    Marland Kitchen HYDROcodone-acetaminophen (NORCO) 10-325 MG per tablet Take 1 tablet by mouth every 6 (six) hours as needed for moderate pain.   0  . LORazepam (ATIVAN) 0.5 MG tablet Take 0.5 mg by mouth every 8 (eight) hours as needed for anxiety or sleep.     . Multiple Vitamin (MULTIVITAMIN WITH MINERALS) TABS tablet Take 1 tablet by mouth daily.    . Omega-3 Fatty Acids (FISH OIL BURP-LESS PO) Take 5 mLs by mouth daily.    Marland Kitchen PARoxetine (PAXIL) 20 MG tablet Take 20 mg by mouth at bedtime.     . polyethylene glycol (MIRALAX / GLYCOLAX) packet Take 17 g by mouth daily.    . valsartan (DIOVAN) 80 MG tablet Take 80 mg by mouth daily.    Marland Kitchen zolpidem (AMBIEN) 10 MG tablet Take 10 mg by mouth at bedtime.         Allergies  Allergen Reactions  . Amantadines Other (See Comments)    Swelling in feet  . Codeine Other (See Comments)    Insomnia Hyperactivity  . Demerol [Meperidine] Other (See Comments)    Insomnia Hyperactivity  . Valium [Diazepam] Other (See Comments)    Insomnia Hyperactivity    REVIEW OF SYSTEMS: Skin:  No history of rash.  No history of abnormal moles. Infection:  No history of hepatitis or HIV.  No history of MRSA. Neurologic:  Parkinson's,  progressive supranuclear palsy- Followed by Drs. Dr. Tonye Royalty - [facility], Dr. Posey Rea - at [facility]        Has some trouble with the Sinemet, but still has to take it Cardiac:  No history of hypertension. No history of heart disease.  No history of prior cardiac catheterization.   Pulmonary:  Evidence of multiple old rib fxes on CXR  Endocrine:  No diabetes. No thyroid disease. Gastrointestinal:  No history of stomach disease.  No history of liver disease.  No history of gall bladder disease.  No history of pancreas disease.  No history of colon disease. Urologic:  No history of kidney stones.  No history of bladder infections. Musculoskeletal:  History of spinal  fusion 2010 -  In [facility] Hematologic:  No bleeding disorder.  No history of anemia.  She is on aspirin. Psycho-social:  The patient is oriented.     SOCIAL and FAMILY HISTORY: Married. Husband Rush Landmark [phone] Has one sone  PHYSICAL EXAM: BP 133/70 mmHg  Pulse 100  Temp(Src) 97.7 F (36.5 C) (Oral)  Resp 18  SpO2 100%  General: Thin older WF who is alert.  She speaks with a whisper and is hard to understand.  In talking to her husband, this is her baseline. HEENT: Normal. Pupils equal.  Scalp laceration Neck: In cervical collar. Lymph Nodes:  No supraclavicular or cervical nodes. Lungs: Clear to auscultation and symmetric breath sounds.  Abrasion left posterior shoulder Heart:  RRR. No murmur or rub. Abdomen: Soft. No mass. No tenderness. No hernia. Normal bowel sounds.  Lower midline incision. Rectal: Not done. Extremities:  Modest strength and ROM  in upper and lower extremities.  Bruise right knee (old) Neurologic:  Grossly intact to motor and sensory function. Psychiatric:  Behavior is normal.   DATA REVIEWED: Epic notes reviewed  Alphonsa Overall, MD,  [facility], [place] [address]  [place], [place]    [place] Phone:  [phone] FAX:  [phone]

## 2015-10-20 NOTE — ED Notes (Signed)
Pressure dressing applied to patient's head.

## 2015-10-20 NOTE — ED Notes (Signed)
MD at bedside suturing. Will get vitals when finished.

## 2015-10-21 ENCOUNTER — Inpatient Hospital Stay (HOSPITAL_COMMUNITY): Payer: Medicare Other

## 2015-10-21 MED ORDER — SODIUM CHLORIDE 0.9 % IJ SOLN: 3.0000 mL | INTRAMUSCULAR | Status: DC | PRN

## 2015-10-21 MED ORDER — MORPHINE SULFATE (PF) 2 MG/ML IV SOLN: 0.5000 mg | INTRAVENOUS | Status: DC | PRN

## 2015-10-21 NOTE — Progress Notes (Signed)
Orthopedic Tech Progress Note Patient Details:  Miranda [name] 08-15-1946 [id number]  Patient ID: Miranda [name], female   DOB: [birth year], 69 y.o.   MRN: [id number] Called in bio-tech brace order; spoke with Miranda [name], Miranda [name] 10/21/2015, 9:55 AM

## 2015-10-21 NOTE — Progress Notes (Signed)
Patient ID: Miranda [name], female   DOB: 01/09/1946, 69 y.o.   MRN: [id number] Patient is well no headache no neck pain neurologically at her baseline c-collar in position CT scan of her head improved okay for discharge home tomorrow.

## 2015-10-21 NOTE — Progress Notes (Signed)
Patient transferred from [place] with husband, she is back to her baseline resting in room with Husband at bedside, new neck brace is on and she and her husband have worked with OT on bathing. Will continue to monitor.

## 2015-10-21 NOTE — Progress Notes (Signed)
Trauma Service Note  Subjective: Patient is awake and alert.  Minimal discomfort.  Has not been out of bed so far.  IVH has resolved on CT.  No neck pain  Objective: Vital signs in last 24 hours: Temp:  [97.5 F (36.4 C)-98.7 F (37.1 C)] 98.2 F (36.8 C) (12/20 0742) Pulse Rate:  [75-116] 93 (12/20 0800) Resp:  [15-36] 23 (12/20 0800) BP: (113-155)/(61-95) 155/81 mmHg (12/20 0800) SpO2:  [98 %-100 %] 99 % (12/20 0800) Last BM Date: 10/20/15  Intake/Output from previous day: 12/19 0701 - 12/20 0700 In: 1747.5 [P.O.:680; I.V.:1067.5] Out: 200 [Urine:200] Intake/Output this shift: Total I/O In: 50 [I.V.:50] Out: -   General: No acute distress  Lungs: Clear  Abd: Soft and benign  Extremities: Intact without clinical evidence of DVT  Neuro: Intact  Lab Results: CBC   Recent Labs  10/20/15 0330  WBC 10.0  HGB 11.7*  HCT 35.6*  PLT 345   BMET  Recent Labs  10/20/15 0330  NA 138  K 4.1  CL 99*  CO2 29  GLUCOSE 127*  BUN 17  CREATININE 0.67  CALCIUM 9.2   PT/INR  Recent Labs  10/20/15 0330  LABPROT 13.6  INR 1.02   ABG No results for input(s): PHART, HCO3 in the last 72 hours.  Invalid input(s): PCO2, PO2  Studies/Results: Ct Head Wo Contrast  10/21/2015  CLINICAL DATA:  Follow up intraventricular hemorrhage. Fall from toilet. History of Parkinson's disease and breast cancer history. EXAM: CT HEAD WITHOUT CONTRAST TECHNIQUE: Contiguous axial images were obtained from the base of the skull through the vertex without intravenous contrast. COMPARISON:  CT head October 20, 2015 FINDINGS: Similar to slightly decreased subcentimeter density in LEFT temporal horn, with small amount of dependent blood products LEFT occipital horn. No hydrocephalus. No intraparenchymal hemorrhage, mass effect, midline shift or acute large vascular territory infarcts. Mild white matter changes compatible chronic small vessel ischemic disease, less than expected for age. No  abnormal extra-axial fluid collections. Basal cisterns are patent. Mild calcific atherosclerosis of the carotid siphons. Paranasal sinuses and mastoid air cells are well aerated. Included ocular globes and orbital contents are unremarkable. Small frontal scalp hematoma with skin staples, no skull fracture. IMPRESSION: Small amount of residual degenerating intraventricular hemorrhage without hydrocephalus. Otherwise negative CT head for age. Electronically Signed   By: Elon Alas M.D.   On: 10/21/2015 05:13   Ct Head Wo Contrast  10/20/2015  CLINICAL DATA:  Fall from toilet with head injury. Parkinson's and breast cancer history. EXAM: CT HEAD WITHOUT CONTRAST CT CERVICAL SPINE WITHOUT CONTRAST TECHNIQUE: Multidetector CT imaging of the head and cervical spine was performed following the standard protocol without intravenous contrast. Multiplanar CT image reconstructions of the cervical spine were also generated. COMPARISON:  09/04/2015 head CT FINDINGS: CT HEAD FINDINGS Skull and Sinuses:Deep frontal scalp laceration without fracture. Visualized orbits: Negative. Brain: Small volume high density within the left lateral ventricle, seen at the atrium and temporal horn. No hydrocephalus. No evidence of infarct, brain swelling, or shift. Mild chronic small vessel ischemic change. CT CERVICAL SPINE FINDINGS C7 right articular process and pedicle fracture without displacement or subluxation. C6 right articular process, lamina, and pedicle fracture without displacement. C3 right transverse process fracture without displacement. There is advanced degenerative disc narrowing from C4-5 to C6-7 with endplate and uncovertebral ridging. C4-5 retrolisthesis which is chronic based on 08/19/2013 cervical spine radiography. Slight C3-4 anterolisthesis is likely degenerative, progressed from prior. No gross canal hematoma. Critical Value/emergent  results were called by telephone at the time of interpretation on 10/20/2015  at 2:04 am to Dr. Joseph Berkshire , who verbally acknowledged these results. Remote posterior right third rib fracture. IMPRESSION: 1. Small volume acute hemorrhage in the left lateral ventricle. No hydrocephalus. 2. Nondisplaced C7 right articular process and pedicle fracture. 3. Nondisplaced C6 right articular process, lamina, and pedicle fracture. 4. Nondisplaced C3 right transverse process fracture. 5. Frontal scalp laceration without fracture. Electronically Signed   By: Monte Fantasia M.D.   On: 10/20/2015 02:08   Ct Cervical Spine Wo Contrast  10/20/2015  CLINICAL DATA:  Fall from toilet with head injury. Parkinson's and breast cancer history. EXAM: CT HEAD WITHOUT CONTRAST CT CERVICAL SPINE WITHOUT CONTRAST TECHNIQUE: Multidetector CT imaging of the head and cervical spine was performed following the standard protocol without intravenous contrast. Multiplanar CT image reconstructions of the cervical spine were also generated. COMPARISON:  09/04/2015 head CT FINDINGS: CT HEAD FINDINGS Skull and Sinuses:Deep frontal scalp laceration without fracture. Visualized orbits: Negative. Brain: Small volume high density within the left lateral ventricle, seen at the atrium and temporal horn. No hydrocephalus. No evidence of infarct, brain swelling, or shift. Mild chronic small vessel ischemic change. CT CERVICAL SPINE FINDINGS C7 right articular process and pedicle fracture without displacement or subluxation. C6 right articular process, lamina, and pedicle fracture without displacement. C3 right transverse process fracture without displacement. There is advanced degenerative disc narrowing from C4-5 to C6-7 with endplate and uncovertebral ridging. C4-5 retrolisthesis which is chronic based on 08/19/2013 cervical spine radiography. Slight C3-4 anterolisthesis is likely degenerative, progressed from prior. No gross canal hematoma. Critical Value/emergent results were called by telephone at the time of  interpretation on 10/20/2015 at 2:04 am to Dr. Joseph Berkshire , who verbally acknowledged these results. Remote posterior right third rib fracture. IMPRESSION: 1. Small volume acute hemorrhage in the left lateral ventricle. No hydrocephalus. 2. Nondisplaced C7 right articular process and pedicle fracture. 3. Nondisplaced C6 right articular process, lamina, and pedicle fracture. 4. Nondisplaced C3 right transverse process fracture. 5. Frontal scalp laceration without fracture. Electronically Signed   By: Monte Fantasia M.D.   On: 10/20/2015 02:08    Anti-infectives: Anti-infectives    None      Assessment/Plan: s/p  DC IVF  Transfer to floor. PT/OT Possible home tomorrow.  LOS: 1 day   Kathryne Eriksson. Dahlia Bailiff, MD, FACS [phone] Trauma Surgeon 10/21/2015

## 2015-10-21 NOTE — Evaluation (Signed)
Occupational Therapy Evaluation Patient Details Name: Miranda [name] MRN: [id number] DOB: Aug 27, 1946 Today's Date: 10/21/2015    History of Present Illness pt presents after falling off of toilet sustaining small L IVH, C6 facet fx, and C7 facet and pedicle fxs.  pt with hx of Parkinson's, HTN, Depression, Lumbar Fusion, and Breast CA.     Clinical Impression   Patient evaluated by Occupational Therapy with no further acute OT needs identified. All education has been completed and the patient has no further questions. See below for any follow-up Occupational Therapy or equipment needs. OT to sign off. Thank you for referral.   Pt has a corner tub that allows for an extra space that works like a bench to complete tub transfer. Pt with spouse steps into tub then sits and then descends into the base of the tub. Pt steps one leg at a time to exit the tub with spouse without changes from baseline. Pt is at an adequate level for d/c home.     Follow Up Recommendations  No OT follow up    Equipment Recommendations  None recommended by OT    Recommendations for Other Services       Precautions / Restrictions Precautions Precautions: Fall Restrictions Weight Bearing Restrictions: No      Mobility Bed Mobility Overal bed mobility: Needs Assistance Bed Mobility: Supine to Sit     Supine to sit: Mod assist     General bed mobility comments: in chair on arrival  Transfers Overall transfer level: Needs assistance Equipment used: 1 person hand held assist Transfers: Sit to/from Stand Sit to Stand: Mod assist         General transfer comment: spouse able to complete all transfers this session mod i with the two of them. Pt requries spouse to transfer    Balance Overall balance assessment: History of Falls                                          ADL Overall ADL's : At baseline                                       General ADL  Comments: pt at or near baseline. pt and spouse educated on don doff of brace. Pt and spouse demo getting into the bottom of a tub in the rehab gym. SPouse concerned with oral care in brace and pt able to complete at baseline level.      Vision     Perception     Praxis      Pertinent Vitals/Pain Pain Assessment: No/denies pain     Hand Dominance Right   Extremity/Trunk Assessment Upper Extremity Assessment Upper Extremity Assessment: Overall WFL for tasks assessed   Lower Extremity Assessment Lower Extremity Assessment: Defer to PT evaluation   Cervical / Trunk Assessment Cervical / Trunk Assessment: Kyphotic   Communication Communication Communication: Expressive difficulties (has microphone to hear patient better)   Cognition Arousal/Alertness: Awake/alert Behavior During Therapy: Flat affect Overall Cognitive Status: History of cognitive impairments - at baseline                     General Comments       Exercises       Shoulder Instructions      Home  Living Family/patient expects to be discharged to:: Private residence Living Arrangements: Spouse/significant other Available Help at Discharge: Family;Available 24 hours/day Type of Home: House Home Access: Stairs to enter [employer] of Steps: 2   Home Layout: Two level;Bed/bath upstairs;1/2 bath on main level Alternate Level Stairs-Number of Steps: flight   Bathroom Shower/Tub: Teacher, early years/pre: Handicapped height (Except one toilet has a riser) Bathroom Accessibility: Yes (Accessible for rollator)   Home Equipment: Walker - 4 wheels;Wheelchair - manual   Additional Comments: pt has an aid also that provides personal care to patient to (A) spouse      Prior Functioning/Environment Level of Independence: Needs assistance  Gait / Transfers Assistance Needed: Husband A for ambulation short distances.  Needs cues to prevent freezes and facilitate weight shifting.   Uses rollator to sit and wheel around house because it is quicker than ambualtion.   ADL's / Homemaking Assistance Needed: Husband A with transfer to sit on bottom of the tub.  Husband washes back and bottom and helps stand to finish lower body dressing.          OT Diagnosis:     OT Problem List:     OT Treatment/Interventions:      OT Goals(Current goals can be found in the care plan section)    OT Frequency:     Barriers to D/C:            Co-evaluation              End of Session Nurse Communication: Mobility status;Precautions  Activity Tolerance: Patient tolerated treatment well Patient left: in chair;with call bell/phone within reach;with chair alarm set;with family/visitor present   Time: [id number] OT Time Calculation (min): 24 min Charges:  OT General Charges $OT Visit: 1 Procedure OT Evaluation $Initial OT Evaluation Tier I: 1 Procedure G-Codes:    Peri Maris November 01, 2015, 12:23 PM  Jeri Modena   OTR/L [phone] Office: [phone] .

## 2015-10-21 NOTE — Evaluation (Signed)
Physical Therapy Evaluation Patient Details Name: Ninna Wilt MRN: [id number] DOB: 01/04/1946 Today's Date: 10/21/2015   History of Present Illness  pt presents after falling off of toilet sustaining small L IVH, C6 facet fx, and C7 facet and pedicle fxs.  pt with hx of Parkinson's, HTN, Depression, Lumbar Fusion, and Breast CA.    Clinical Impression  Pt appears to be moving not far from baseline level.  In discussion with husband, pt and he have been working together very well to A pt with mobility and they have a good routine.  Feel pt and husband will be able to return to home and continue to mobilize together.  Will continue to follow.      Follow Up Recommendations Home health PT;Supervision/Assistance - 24 hour    Equipment Recommendations  None recommended by PT    Recommendations for Other Services       Precautions / Restrictions Precautions Precautions: Fall Restrictions Weight Bearing Restrictions: No      Mobility  Bed Mobility Overal bed mobility: Needs Assistance Bed Mobility: Supine to Sit     Supine to sit: Mod assist     General bed mobility comments: in chair on arrival  Transfers Overall transfer level: Needs assistance Equipment used: 1 person hand held assist Transfers: Sit to/from Stand Sit to Stand: Mod assist         General transfer comment: spouse able to complete all transfers this session mod i with the two of them. Pt requries spouse to transfer  Ambulation/Gait Ambulation/Gait assistance: Min assist;+2 safety/equipment Ambulation Distance (Feet): 100 Feet (and 30) Assistive device: 1 person hand held assist Gait Pattern/deviations: Step-through pattern;Decreased stride length;Festinating;Narrow base of support;Trunk flexed     General Gait Details: pt needs consistent cues to prevent Parkinson's type freezing during ambulation and does well with counting "1, 2, 1, 2..."  Stairs            Wheelchair Mobility     Modified Rankin (Stroke Patients Only)       Balance Overall balance assessment: History of Falls Sitting-balance support: Bilateral upper extremity supported;Feet supported Sitting balance-Leahy Scale: Poor Sitting balance - Comments: pt has difficulty correcting balance. Postural control: Posterior lean Standing balance support: Bilateral upper extremity supported;During functional activity Standing balance-Leahy Scale: Poor                               Pertinent Vitals/Pain Pain Assessment: No/denies pain    Home Living Family/patient expects to be discharged to:: Private residence Living Arrangements: Spouse/significant other Available Help at Discharge: Family;Available 24 hours/day Type of Home: House Home Access: Stairs to enter   [employer] of Steps: 2 Home Layout: Two level;Bed/bath upstairs;1/2 bath on main level Home Equipment: Walker - 4 wheels;Wheelchair - manual Additional Comments: pt has an aid also that provides personal care to patient to (A) spouse    Prior Function Level of Independence: Needs assistance   Gait / Transfers Assistance Needed: Husband A for ambulation short distances.  Needs cues to prevent freezes and facilitate weight shifting.  Uses rollator to sit and wheel around house because it is quicker than ambualtion.    ADL's / Homemaking Assistance Needed: Husband A with transfer to sit on bottom of the tub.  Husband washes back and bottom and helps stand to finish lower body dressing.          Hand Dominance   Dominant Hand: Right  Extremity/Trunk Assessment   Upper Extremity Assessment: Overall WFL for tasks assessed           Lower Extremity Assessment: Defer to PT evaluation      Cervical / Trunk Assessment: Kyphotic  Communication   Communication: Expressive difficulties (has microphone to hear patient better)  Cognition Arousal/Alertness: Awake/alert Behavior During Therapy: Flat  affect Overall Cognitive Status: History of cognitive impairments - at baseline                      General Comments      Exercises        Assessment/Plan    PT Assessment Patient needs continued PT services  PT Diagnosis Difficulty walking   PT Problem List Decreased strength;Decreased activity tolerance;Decreased balance;Decreased mobility;Decreased coordination;Decreased cognition;Decreased knowledge of use of DME  PT Treatment Interventions DME instruction;Stair training;Gait training;Functional mobility training;Therapeutic activities;Therapeutic exercise;Balance training;Patient/family education   PT Goals (Current goals can be found in the Care Plan section) Acute Rehab PT Goals Patient Stated Goal: Per husband to return to home ASAP. PT Goal Formulation: With patient Time For Goal Achievement: 11/04/15 Potential to Achieve Goals: Good    Frequency Min 5X/week   Barriers to discharge        Co-evaluation               End of Session Equipment Utilized During Treatment: Gait belt Activity Tolerance: Patient tolerated treatment well Patient left: in chair;with call bell/phone within reach;with family/visitor present Nurse Communication: Mobility status         Time: [id number] PT Time Calculation (min) (ACUTE ONLY): 32 min   Charges:   PT Evaluation $Initial PT Evaluation Tier I: 1 Procedure PT Treatments $Gait Training: 8-22 mins   PT G CodesCatarina Hartshorn, [place] 10/21/2015, 12:23 PM

## 2015-10-21 NOTE — Evaluation (Signed)
Speech Language Pathology Evaluation Patient Details Name: Miranda [name] MRN: [id number] DOB: 07/28/1946 Today's Date: 10/21/2015 Time: [id number] SLP Time Calculation (min) (ACUTE ONLY): 29 min  Problem List:  Patient Active Problem List   Diagnosis Date Noted  . Head injury 10/20/2015  . Breast cancer, right breast ([place]) 11/20/2012   Past Medical History:  Past Medical History  Diagnosis Date  . Breast cancer ([place]) 1998    lumpectomy  . Parkinson's disease ([place]) 2012  . Depression   . Hypertension   . 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 Multiple falls    Past Surgical History:  Past Surgical History  Procedure Laterality Date  . Lumpectomy right Right 1998    stage IA, DCIS with micrinvasion, ER+/PR+, , right breast lower inner quadrant  . Cesarean section  1975  . Anterior release vertebral body w/ posterior fusion  2010    L1-S1  . Spinal fusion     HPI:  Patient is a 69 year old female with a history of Progressive Supranuclear Palsy lives at home scheduled for by her husband she fell off the toilet striking her head grating a scalp laceration and a small amount of interventricular hemorrhage in addition she also sustained a cervical spine fracture that was diagnosed on CT of her C-spine.  IVH resolved per CT scan.   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  Cognitive evaluation revealed attention, memory, and reasoning all WFL.  No acute SLP needs are identified.  Family is well-versed in communication access and is tied-in with SLPs at [facility] when needed.  Will sign off.     SLP Assessment       Follow Up Recommendations  None    Frequency and Duration           SLP Evaluation Prior Functioning  Cognitive/Linguistic Baseline:  Within functional limits Type of Home: House  Lives With: Spouse Available Help at Discharge: Family;Available 24 hours/day   Cognition  Overall Cognitive Status: Within Functional Limits for tasks assessed Arousal/Alertness: Awake/alert Orientation Level: Oriented X4 Attention: Selective Selective Attention: Appears intact Memory: Appears intact Awareness: Appears intact Problem Solving: Appears intact Executive Function: Reasoning Reasoning: Appears intact    Comprehension  Auditory Comprehension Overall Auditory Comprehension: Appears within functional limits for tasks assessed Visual Recognition/Discrimination Discrimination: Within Function Limits Reading Comprehension Reading Status: Within funtional limits    Expression Expression Primary Mode of Expression: Verbal Verbal Expression Overall Verbal Expression: Appears within functional limits for tasks assessed Written Expression Dominant Hand: Right Written Expression: Not tested   Oral / Motor Oral Motor/Sensory Function Overall Oral Motor/Sensory Function: Within functional limits Motor Speech Overall Motor Speech: Impaired at baseline Respiration: Impaired Level of Impairment: Word Phonation: Low vocal intensity Resonance: Within functional limits Articulation: Within functional limitis Intelligibility: Intelligibility reduced Word: 25-49% accurate Phrase: 25-49% accurate Sentence: 25-49% accurate Conversation: 25-49% accurate    Miranda [name] 10/21/2015, 3:43 PM

## 2015-10-21 NOTE — Care Management Note (Signed)
Case Management Note  Patient Details  Name: Miranda [name] MRN: [id number] Date of Birth: 04/15/1946  Subjective/Objective:    Pt admitted on 10/20/15 s/p fall with IVH, C6-C7 fractures.  PTA, pt resided at home and is cared for by husband.                 Action/Plan: Will follow for discharge planning as pt progresses.    Expected Discharge Date:                  Expected Discharge Plan:     In-House Referral:     Discharge planning Services   CM referral  Post Acute Care Choice:    Choice offered to:     DME Arranged:    DME Agency:     HH Arranged:    HH Agency:     Status of Service:   In process, will continue to follow  Medicare Important Message Given:    Date Medicare IM Given:    Medicare IM give by:    Date Additional Medicare IM Given:    Additional Medicare Important Message give by:     If discussed at [place] of Stay Meetings, dates discussed:    Additional Comments:  Reinaldo Raddle, RN, BSN  Trauma/Neuro ICU Case Manager [phone]

## 2015-10-22 DIAGNOSIS — W19XXXA Unspecified fall, initial encounter: Secondary | ICD-10-CM | POA: Diagnosis present

## 2015-10-22 DIAGNOSIS — G20 Parkinson's disease: Secondary | ICD-10-CM | POA: Diagnosis present

## 2015-10-22 DIAGNOSIS — S129XXA Fracture of neck, unspecified, initial encounter: Secondary | ICD-10-CM | POA: Diagnosis present

## 2015-10-22 DIAGNOSIS — S0101XA Laceration without foreign body of scalp, initial encounter: Secondary | ICD-10-CM | POA: Diagnosis present

## 2015-10-22 NOTE — Progress Notes (Signed)
Patient ID: Miranda [name], female   DOB: 1946/09/27, 69 y.o.   MRN: [id number]   LOS: 2 days   Subjective: Doing well, ready to go home.   Objective: Vital signs in last 24 hours: Temp:  [97.7 F (36.5 C)-98.5 F (36.9 C)] 97.7 F (36.5 C) (12/21 0526) Pulse Rate:  [86-108] 86 (12/21 0526) Resp:  [19-24] 20 (12/21 0526) BP: (129-158)/(60-75) 155/72 mmHg (12/21 0526) SpO2:  [98 %-100 %] 100 % (12/21 0526) Last BM Date: 10/20/15   Physical Exam General appearance: alert and no distress Resp: clear to auscultation bilaterally Cardio: regular rate and rhythm GI: normal findings: bowel sounds normal and soft, non-tender   Assessment/Plan: Fall Scalp lac -- Local care TBI -- At baseline C-spine fxs -- Collar Multiple medical problems -- Home meds Dispo -- Home today    Lisette Abu, PA-C Pager: [phone] General Trauma PA Pager: [phone]  10/22/2015

## 2015-10-22 NOTE — Discharge Summary (Signed)
Physician Discharge Summary  Patient ID: Miranda [name] MRN: [id number] DOB/AGE: 1946-08-09 69 y.o.  Admit date: 10/20/2015 Discharge date: 10/22/2015  Discharge Diagnoses Patient Active Problem List   Diagnosis Date Noted  . Fall 10/22/2015  . Scalp laceration 10/22/2015  . Multiple fractures of cervical spine ([place]) 10/22/2015  . Parkinson's disease ([place]) 10/22/2015  . TBI (traumatic brain injury) ([place]) 10/20/2015  . Breast cancer, right breast ([place]) 11/20/2012    Consultants Dr. Kary [name] for neurosurgery   Procedures 12/19 -- Repair of scalp laceration by Dr. Joseph [name]   HPI: Miranda [name] presented to the Emergency Department complaining of a fall. Per her spouse she was sitting on the toilet when she fell forward reaching for something and hit her head on the wall. He stated that they were initially able to control the bleeding but it started bleeding again and was currently uncontrolled. Workup included CT scans of the head and cervical spine which showed the above-mentioned injuries. Her laceration was closed in the ED and neurosurgery was consulted. She was admitted to the trauma service.   Hospital Course: Neurosurgery recommended non-operative treatment of her head and neck injuries. She was placed in an [place] collar. A repeat head CT the following day showed resolution of her interventricular hemorrhage. She remained at baseline neurologically. She was evaluated by the traumatic brain injury therapy team who recommended home with home health therapies. She was discharged home in good condition.     Medication List    TAKE these medications        alendronate 70 MG tablet  Commonly known as:  FOSAMAX  Take 70 mg by mouth every Monday. Take with a full glass of water on an empty stomach.     aspirin EC 81 MG tablet  Take 81 mg by mouth daily.     buPROPion 300 MG 24 hr tablet  Commonly known as:  WELLBUTRIN XL  Take 300 mg by mouth every morning.     calcium citrate-vitamin D 315-200 MG-UNIT tablet  Commonly known as:  CITRACAL+D  Take 2 tablets by mouth daily.     carbidopa-levodopa 25-100 MG tablet  Commonly known as:  SINEMET IR  Take 1 tablet by mouth 3 (three) times daily.     carboxymethylcellulose 0.5 % Soln  Commonly known as:  REFRESH PLUS  Apply 1 drop to eye 3 (three) times daily as needed (for dry eyes).     EQL COQ10 300 MG Caps  Generic drug:  Coenzyme Q10  Take 2,400 mg by mouth daily.     EYE LUBRICANT Oint  Apply 1 application to eye at bedtime.     FISH OIL BURP-LESS PO  Take 5 mLs by mouth daily.     HYDROcodone-acetaminophen 10-325 MG tablet  Commonly known as:  NORCO  Take 1 tablet by mouth every 6 (six) hours as needed for moderate pain.     LORazepam 0.5 MG tablet  Commonly known as:  ATIVAN  Take 0.5 mg by mouth every 8 (eight) hours as needed for anxiety or sleep.     multivitamin with minerals Tabs tablet  Take 1 tablet by mouth daily.     PARoxetine 20 MG tablet  Commonly known as:  PAXIL  Take 20 mg by mouth at bedtime.     polyethylene glycol packet  Commonly known as:  MIRALAX / GLYCOLAX  Take 17 g by mouth daily.     valsartan 80 MG tablet  Commonly known as:  DIOVAN  Take 80 mg by mouth daily.     zolpidem 10 MG tablet  Commonly known as:  AMBIEN  Take 10 mg by mouth at bedtime.             Follow-up Information    Schedule an appointment as soon as possible for a visit with [facility] P, MD.   Specialty:  Neurosurgery   Contact information:   [address] [place] [address] [place] [address] [phone]       Follow up with [place] [facility] On 10/29/2015.   Why:  3:00PM for staple removal   Contact information:   [place] [phone]      Signed: Lisette Abu, PA-C Pager: [phone] General Trauma PA Pager: [phone] 10/22/2015, 10:28 AM

## 2015-10-22 NOTE — Progress Notes (Signed)
Discharge orders received, Pt for discharge home. IV d/c'd. D/c instructions and RX given with verbalized understanding. Family at bedside to assist patient with discharge. Staff bought pt downstairs via wheelchair.10/22/15 1121

## 2015-10-22 NOTE — Discharge Instructions (Signed)
Wash wounds daily in shower with soap and water. Do not soak. Apply antibiotic ointment (e.g. Neosporin) twice daily and as needed to keep moist.  Keep collar on at all times.

## 2015-10-22 NOTE — Care Management Note (Signed)
Case Management Note  Patient Details  Name: Miranda [name] MRN: [id number] Date of Birth: [birth year]  Subjective/Objective:    Pt for dc home today with spouse.  Spoke with pt and husband, by phone, post-discharge regarding home health arrangements.  Husband states pt has personal care assistant for 2-4 hrs daily, but has never had [place] services.  Both pt and husband are agreeable. To HH follow up.                  Action/Plan: Referral to [facility] PT and OT, per pt/husband choice.  Start of care 24-48h post dc date.  No DME needs.    Expected Discharge Date:    10/22/15             Expected Discharge Plan:  [place]  In-House Referral:     Discharge planning Services  CM Consult  Post Acute Care Choice:    Choice offered to:     DME Arranged:    DME Agency:     HH Arranged:  PT, OT HH Agency:  [place]  Status of Service:  Completed, signed off  Medicare Important Message Given:    Date Medicare IM Given:    Medicare IM give by:    Date Additional Medicare IM Given:    Additional Medicare Important Message give by:     If discussed at [place] of Stay Meetings, dates discussed:    Additional Comments:  Reinaldo Raddle, RN, BSN  Trauma/Neuro ICU Case Manager [phone]

## 2015-10-22 NOTE — Progress Notes (Signed)
Physical Therapy Treatment Patient Details Name: Miranda [name] MRN: [id number] DOB: [birth year] Today's Date: 10/22/2015    History of Present Illness pt presents after falling off of toilet sustaining small L IVH, C6 facet fx, and C7 facet and pedicle fxs.  pt with hx of Parkinson's, HTN, Depression, Lumbar Fusion, and Breast CA.      PT Comments    Pt progressing towards physical therapy goals. Was able to perform transfers and ambulation with min to mod assist, and +2 was helpful during ambulation and stair training. Due to fatigue, flight of stairs was not practiced, but only 5 to ensure safe sequencing and determine proper level of assist required. Pt's hired caregiver was present during session for assistance. Will continue to follow and progress as able per POC.   Follow Up Recommendations  Home health PT;Supervision/Assistance - 24 hour     Equipment Recommendations  None recommended by PT    Recommendations for Other Services       Precautions / Restrictions Precautions Precautions: Fall Restrictions Weight Bearing Restrictions: No    Mobility  Bed Mobility Overal bed mobility: Needs Assistance Bed Mobility: Supine to Sit     Supine to sit: Min assist     General bed mobility comments: HOB elevated, and pt able to transition to EOB with min assist for scooting and trunk support.  Transfers Overall transfer level: Needs assistance Equipment used: 1 person hand held assist Transfers: Sit to/from Stand Sit to Stand: Mod assist         General transfer comment: Pt with significant pull on therapist's hand to stand. Noted increased extension in hips and support behind shoulders was helpful for anterior translation when standing.   Ambulation/Gait Ambulation/Gait assistance: Min assist;+2 physical assistance Ambulation Distance (Feet): 125 Feet Assistive device: 2 person hand held assist Gait Pattern/deviations: Step-through pattern;Decreased stride  length;Festinating;Narrow base of support;Trunk flexed Gait velocity: Decreased Gait velocity interpretation: Below normal speed for age/gender General Gait Details: Pt needs consistent cues to prevent Parkinson's type freezing during ambulation and does well with counting "1, 2, 1, 2..."   Stairs Stairs: Yes Stairs assistance: +2 safety/equipment Stair Management: Sideways;One rail Left Number of Stairs: 5 General stair comments: Per caregiver who was present, husband assists with stairs at home. Pt was able to negotiate 5 stairs with mod assist for balance and safety.   Wheelchair Mobility    Modified Rankin (Stroke Patients Only)       Balance Overall balance assessment: History of Falls;Needs assistance Sitting-balance support: Feet supported;Bilateral upper extremity supported Sitting balance-Leahy Scale: Poor Sitting balance - Comments: pt has difficulty correcting balance. Postural control: Posterior lean Standing balance support: Bilateral upper extremity supported;During functional activity Standing balance-Leahy Scale: Poor                      Cognition Arousal/Alertness: Awake/alert Behavior During Therapy: Flat affect Overall Cognitive Status: Within Functional Limits for tasks assessed                      Exercises      General Comments        Pertinent Vitals/Pain Pain Assessment: No/denies pain    Home Living Family/patient expects to be discharged to:: Private residence                    Prior Function            PT Goals (current goals can now be found in  the care plan section) Acute Rehab PT Goals Patient Stated Goal: Per husband to return to home ASAP. PT Goal Formulation: With patient Time For Goal Achievement: 11/04/15 Potential to Achieve Goals: Good Progress towards PT goals: Progressing toward goals    Frequency  Min 5X/week    PT Plan Current plan remains appropriate    Co-evaluation              End of Session Equipment Utilized During Treatment: Gait belt Activity Tolerance: Patient tolerated treatment well Patient left: in chair;with call bell/phone within reach;with family/visitor present     Time: [id number] PT Time Calculation (min) (ACUTE ONLY): 19 min  Charges:  $Gait Training: 8-22 mins                    G Codes:      Rolinda Roan 11/21/2015, 8:55 AM   Rolinda Roan, PT, DPT Acute Rehabilitation Services Pager: [phone]

## 2016-01-21 ENCOUNTER — Emergency Department (HOSPITAL_COMMUNITY)
Admission: EM | Admit: 2016-01-21 | Discharge: 2016-01-21 | Disposition: A | Discharge status: Home / Self Care | Payer: Medicare Other | Attending: Emergency Medicine | Admitting: Emergency Medicine

## 2016-01-21 ENCOUNTER — Emergency Department (HOSPITAL_COMMUNITY): Payer: Medicare Other

## 2016-01-21 ENCOUNTER — Encounter (HOSPITAL_COMMUNITY): Payer: Self-pay | Admitting: Family Medicine

## 2016-01-21 DIAGNOSIS — Y998 Other external cause status: Secondary | ICD-10-CM | POA: Insufficient documentation

## 2016-01-21 DIAGNOSIS — Y9389 Activity, other specified: Secondary | ICD-10-CM | POA: Diagnosis not present

## 2016-01-21 DIAGNOSIS — G20 Parkinson's disease: Secondary | ICD-10-CM | POA: Diagnosis not present

## 2016-01-21 DIAGNOSIS — S0990XA Unspecified injury of head, initial encounter: Secondary | ICD-10-CM | POA: Diagnosis present

## 2016-01-21 DIAGNOSIS — S0101XA Laceration without foreign body of scalp, initial encounter: Secondary | ICD-10-CM | POA: Insufficient documentation

## 2016-01-21 DIAGNOSIS — I10 Essential (primary) hypertension: Secondary | ICD-10-CM | POA: Insufficient documentation

## 2016-01-21 DIAGNOSIS — Z853 Personal history of malignant neoplasm of breast: Secondary | ICD-10-CM | POA: Insufficient documentation

## 2016-01-21 DIAGNOSIS — W01198A Fall on same level from slipping, tripping and stumbling with subsequent striking against other object, initial encounter: Secondary | ICD-10-CM | POA: Diagnosis not present

## 2016-01-21 DIAGNOSIS — Z7982 Long term (current) use of aspirin: Secondary | ICD-10-CM | POA: Insufficient documentation

## 2016-01-21 DIAGNOSIS — Y9289 Other specified places as the place of occurrence of the external cause: Secondary | ICD-10-CM | POA: Diagnosis not present

## 2016-01-21 DIAGNOSIS — W19XXXA Unspecified fall, initial encounter: Secondary | ICD-10-CM

## 2016-01-21 DIAGNOSIS — Z79899 Other long term (current) drug therapy: Secondary | ICD-10-CM | POA: Insufficient documentation

## 2016-01-21 MED ORDER — LIDOCAINE-EPINEPHRINE (PF) 2 %-1:200000 IJ SOLN
10.0000 mL | Freq: Once | INTRAMUSCULAR | Status: AC
  Administered 2016-01-21: 10 mL
  Filled 2016-01-21: qty 20

## 2016-01-21 MED ORDER — BACITRACIN ZINC 500 UNIT/GM EX OINT
1.0000 "application " | TOPICAL_OINTMENT | Freq: Two times a day (BID) | CUTANEOUS | Status: DC
  Administered 2016-01-21 (×2): 1 via TOPICAL

## 2016-01-21 NOTE — ED Notes (Signed)
PT Husband reported he was going to take Pt to bathroom . Staff offered to help but Husband refused.

## 2016-01-21 NOTE — ED Provider Notes (Signed)
CSN: [id number]     Arrival date & time 01/21/16  [id number] History   First MD Initiated Contact with Patient 01/21/16 [phone]     Chief Complaint  Patient presents with  . Fall  . Head Injury      Patient is a 70 y.o. female presenting with fall and head injury.  Fall Pertinent negatives include no chest pain, no abdominal pain and no shortness of breath.  Head Injury Patient is a history of progressive supranuclear palsy. With this she is very unstable at baseline and minimally ambulatory. She fell off the toilet J and struck her head. Laceration to scalp. No other complaints. She is not on anticoagulation. No neck pain. No loss of consciousness.  Past Medical History  Diagnosis Date  . Breast cancer ([place]) 1998    lumpectomy  . Parkinson's disease ([place]) 2012  . Depression   . Hypertension   . Progressive supranuclear palsy ([place])   . Multiple falls    Past Surgical History  Procedure Laterality Date  . Lumpectomy right Right 1998    stage IA, DCIS with micrinvasion, ER+/PR+, , right breast lower inner quadrant  . Cesarean section  1975  . Anterior release vertebral body w/ posterior fusion  2010    L1-S1  . Spinal fusion     Family History  Problem Relation Age of Onset  . Stroke Father   . Cancer Sister   . Cancer Son 9    leiomyosarcoma   Social History  Substance Use Topics  . Smoking status: Never Smoker   . Smokeless tobacco: Never Used  . Alcohol Use: 0.0 oz/week    0 Glasses of wine per week   OB History    No data available     Review of Systems  Constitutional: Negative for appetite change.  Respiratory: Negative for shortness of breath.   Cardiovascular: Negative for chest pain.  Gastrointestinal: Negative for abdominal pain.  Genitourinary: Negative for flank pain.  Skin: Positive for wound.  Hematological: Does not bruise/bleed easily.  Psychiatric/Behavioral: Negative for confusion.      Allergies  Amantadines; Codeine; Demerol; and  Valium  Home Medications   Prior to Admission medications   Medication Sig Start Date End Date Taking? Authorizing Provider  alendronate (FOSAMAX) 70 MG tablet Take 70 mg by mouth every Monday. Take with a full glass of water on an empty stomach.   Yes Historical Provider, MD  Artificial Tear Ointment (EYE LUBRICANT) OINT Apply 1 application to eye at bedtime.   Yes Historical Provider, MD  aspirin EC 81 MG tablet Take 81 mg by mouth daily.   Yes Historical Provider, MD  buPROPion (WELLBUTRIN XL) 300 MG 24 hr tablet Take 300 mg by mouth every morning.    Yes Historical Provider, MD  calcium citrate-vitamin D (CITRACAL+D) 315-200 MG-UNIT tablet Take 2 tablets by mouth daily.   Yes Historical Provider, MD  carbidopa-levodopa (SINEMET IR) 25-100 MG per tablet Take 1 tablet by mouth 3 (three) times daily.    Yes Historical Provider, MD  carboxymethylcellulose (REFRESH PLUS) 0.5 % SOLN Apply 1 drop to eye 3 (three) times daily as needed (for dry eyes).   Yes Historical Provider, MD  Coenzyme Q10 (EQL COQ10) 300 MG CAPS Take 2,400 mg by mouth daily.   Yes Historical Provider, MD  HYDROcodone-acetaminophen (NORCO) 10-325 MG per tablet Take 1 tablet by mouth every 6 (six) hours as needed for moderate pain.  11/06/14  Yes Historical Provider, MD  LORazepam (ATIVAN)  0.5 MG tablet Take 0.5 mg by mouth every 8 (eight) hours as needed for anxiety or sleep.  12/22/14  Yes Historical Provider, MD  Multiple Vitamin (MULTIVITAMIN WITH MINERALS) TABS tablet Take 1 tablet by mouth daily.   Yes Historical Provider, MD  Omega-3 Fatty Acids (FISH OIL BURP-LESS PO) Take 5 mLs by mouth daily.   Yes Historical Provider, MD  PARoxetine (PAXIL) 20 MG tablet Take 20 mg by mouth at bedtime.  01/07/14  Yes Historical Provider, MD  polyethylene glycol (MIRALAX / GLYCOLAX) packet Take 17 g by mouth daily.   Yes Historical Provider, MD  valsartan (DIOVAN) 80 MG tablet Take 80 mg by mouth daily.   Yes Historical Provider, MD   zolpidem (AMBIEN) 10 MG tablet Take 10 mg by mouth at bedtime.  08/28/13  Yes Historical Provider, MD   BP 148/75 mmHg  Pulse 107  Temp(Src)   Resp 16  SpO2 100% Physical Exam  Constitutional: She appears well-developed.  HENT:  4cm laceration to left parietal area. No underlying bony tenderness.  Eyes: EOM are normal.  Neck: Neck supple.  No tenderness or deformity.  Cardiovascular: Normal rate.   Pulmonary/Chest: Effort normal.  Abdominal: There is no tenderness.  Musculoskeletal: She exhibits no tenderness.  Neurological: She is alert.  Patient is chronically constricted and has movement difficulties.  Skin: Skin is warm.  Vitals reviewed.   ED Course  Procedures (including critical care time) Labs Review Labs Reviewed - No data to display  Imaging Review Ct Head Wo Contrast  01/21/2016  CLINICAL DATA:  [place] off the toilet, hit top of the head, top of the head laceration, history of Parkinson EXAM: CT HEAD WITHOUT CONTRAST CT CERVICAL SPINE WITHOUT CONTRAST TECHNIQUE: Multidetector CT imaging of the head and cervical spine was performed following the standard protocol without intravenous contrast. Multiplanar CT image reconstructions of the cervical spine were also generated. COMPARISON:  10/21/2015 FINDINGS: CT HEAD FINDINGS No skull fracture is noted. There is scalp swelling and small skin irregularity in left parietal scalp high convexity is see axial image 28. No intracranial hemorrhage, mass effect or midline shift. Stable cerebral atrophy. Ventricular size is stable from prior exam. No intraventricular hemorrhage. No acute cortical infarction. No mass lesion is noted on this unenhanced scan. Stable mild periventricular chronic white matter disease. CT CERVICAL SPINE FINDINGS Axial images of the cervical spine shows no acute fracture or subluxation. Computer processed images shows mild kyphosis. Degenerative changes are noted C1-C2 articulation. There is disc space flattening  with mild anterior spurring at C3-C4 level. Moderate disc space flattening with mild anterior and mild posterior spurring at C4-C5 and C5-C6 level. There is moderate disc space flattening with mild anterior and mild posterior spurring at C6-C7 level. Minimal anterolisthesis about 1.5 mm C7 on T1 vertebral body. No prevertebral soft tissue swelling. Cervical airway is patent. Mild spinal canal stenosis due to posterior spurring at C5-C6 and C6-C7 level. There is no pneumothorax in visualized lung apices. IMPRESSION: 1. No acute intracranial abnormality. Stable atrophy and chronic white matter disease. There is mild scalp swelling in left parietal region high convexity and minimal skin irregularity. Please see axial image 28. No definite acute cortical infarction. 2. No cervical spine acute fracture or subluxation. Mild kyphosis. Multilevel degenerative changes as described above. 3. No prevertebral soft tissue swelling. Cervical airway is patent. No pneumothorax in visualized lung apices. Electronically Signed   By: Lahoma Crocker M.D.   On: 01/21/2016 09:54   Ct Cervical Spine  Wo Contrast  01/21/2016  CLINICAL DATA:  [place] off the toilet, hit top of the head, top of the head laceration, history of Parkinson EXAM: CT HEAD WITHOUT CONTRAST CT CERVICAL SPINE WITHOUT CONTRAST TECHNIQUE: Multidetector CT imaging of the head and cervical spine was performed following the standard protocol without intravenous contrast. Multiplanar CT image reconstructions of the cervical spine were also generated. COMPARISON:  10/21/2015 FINDINGS: CT HEAD FINDINGS No skull fracture is noted. There is scalp swelling and small skin irregularity in left parietal scalp high convexity is see axial image 28. No intracranial hemorrhage, mass effect or midline shift. Stable cerebral atrophy. Ventricular size is stable from prior exam. No intraventricular hemorrhage. No acute cortical infarction. No mass lesion is noted on this unenhanced scan.  Stable mild periventricular chronic white matter disease. CT CERVICAL SPINE FINDINGS Axial images of the cervical spine shows no acute fracture or subluxation. Computer processed images shows mild kyphosis. Degenerative changes are noted C1-C2 articulation. There is disc space flattening with mild anterior spurring at C3-C4 level. Moderate disc space flattening with mild anterior and mild posterior spurring at C4-C5 and C5-C6 level. There is moderate disc space flattening with mild anterior and mild posterior spurring at C6-C7 level. Minimal anterolisthesis about 1.5 mm C7 on T1 vertebral body. No prevertebral soft tissue swelling. Cervical airway is patent. Mild spinal canal stenosis due to posterior spurring at C5-C6 and C6-C7 level. There is no pneumothorax in visualized lung apices. IMPRESSION: 1. No acute intracranial abnormality. Stable atrophy and chronic white matter disease. There is mild scalp swelling in left parietal region high convexity and minimal skin irregularity. Please see axial image 28. No definite acute cortical infarction. 2. No cervical spine acute fracture or subluxation. Mild kyphosis. Multilevel degenerative changes as described above. 3. No prevertebral soft tissue swelling. Cervical airway is patent. No pneumothorax in visualized lung apices. Electronically Signed   By: Lahoma Crocker M.D.   On: 01/21/2016 09:54   I have personally reviewed and evaluated these images and lab results as part of my medical decision-making.   EKG Interpretation None      MDM   Final diagnoses:  Fall, initial encounter  Scalp laceration, initial encounter    Patient with fall. Has scalp laceration that was closed. Will discharge home. No other severe injury.    Davonna Belling, MD 01/21/16 1031

## 2016-01-21 NOTE — ED Notes (Signed)
Pt here for fall off the toilet this am. sts hit head on the corner of a scale. Laceration and hematoma to the top of head. Bleeding controlled. Pt not on blood thinners.

## 2016-01-21 NOTE — Discharge Instructions (Signed)
Stitches, Staples, or Adhesive Wound Closure  °Health care providers use stitches (sutures), staples, and certain glue (skin adhesives) to hold skin together while it heals (wound closure). You may need this treatment after you have surgery or if you cut your skin accidentally. These methods help your skin to heal more quickly and make it less likely that you will have a scar. A wound may take several months to heal completely.  °The type of wound you have determines when your wound gets closed. In most cases, the wound is closed as soon as possible (primary skin closure). Sometimes, closure is delayed so the wound can be cleaned and allowed to heal naturally. This reduces the chance of infection. Delayed closure may be needed if your wound:  °Is caused by a bite.  °Happened more than 6 hours ago.  °Involves loss of skin or the tissues under the skin.  °Has dirt or debris in it that cannot be removed.  °Is infected. °WHAT ARE THE DIFFERENT KINDS OF WOUND CLOSURES?  °There are many options for wound closure. The one that your health care provider uses depends on how deep and how large your wound is.  °Adhesive Glue  °To use this type of glue to close a wound, your health care provider holds the edges of the wound together and paints the glue on the surface of your skin. You may need more than one layer of glue. Then the wound may be covered with a light bandage (dressing).  °This type of skin closure may be used for small wounds that are not deep (superficial). Using glue for wound closure is less painful than other methods. It does not require a medicine that numbs the area (local anesthetic). This method also leaves nothing to be removed. Adhesive glue is often used for children and on facial wounds.  °Adhesive glue cannot be used for wounds that are deep, uneven, or bleeding. It is not used inside of a wound.  °Adhesive Strips  °These strips are made of sticky (adhesive), porous paper. They are applied across your  skin edges like a regular adhesive bandage. You leave them on until they fall off.  °Adhesive strips may be used to close very superficial wounds. They may also be used along with sutures to improve the closure of your skin edges.  °Sutures  °Sutures are the oldest method of wound closure. Sutures can be made from natural substances, such as silk, or from synthetic materials, such as nylon and steel. They can be made from a material that your body can break down as your wound heals (absorbable), or they can be made from a material that needs to be removed from your skin (nonabsorbable). They come in many different strengths and sizes.  °Your health care provider attaches the sutures to a steel needle on one end. Sutures can be passed through your skin, or through the tissues beneath your skin. Then they are tied and cut. Your skin edges may be closed in one continuous stitch or in separate stitches.  °Sutures are strong and can be used for all kinds of wounds. Absorbable sutures may be used to close tissues under the skin. The disadvantage of sutures is that they may cause skin reactions that lead to infection. Nonabsorbable sutures need to be removed.  °Staples  °When surgical staples are used to close a wound, the edges of your skin on both sides of the wound are brought close together. A staple is placed across the wound, and   an instrument secures the edges together. Staples are often used to close surgical cuts (incisions).  °Staples are faster to use than sutures, and they cause less skin reaction. Staples need to be removed using a tool that bends the staples away from your skin.  °HOW DO I CARE FOR MY WOUND CLOSURE?  °Take medicines only as directed by your health care provider.  °If you were prescribed an antibiotic medicine for your wound, finish it all even if you start to feel better.  °Use ointments or creams only as directed by your health care provider.  °Wash your hands with soap and water before and  after touching your wound.  °Do not soak your wound in water. Do not take baths, swim, or use a hot tub until your health care provider approves.  °Ask your health care provider when you can start showering. Cover your wound if directed by your health care provider.  °Do not take out your own sutures or staples.  °Do not pick at your wound. Picking can cause an infection.  °Keep all follow-up visits as directed by your health care provider. This is important. °HOW LONG WILL I HAVE MY WOUND CLOSURE?  °Leave adhesive glue on your skin until the glue peels away.  °Leave adhesive strips on your skin until the strips fall off.  °Absorbable sutures will dissolve within several days.  °Nonabsorbable sutures and staples must be removed. The location of the wound will determine how long they stay in. This can range from several days to a couple of weeks. °WHEN SHOULD I SEEK HELP FOR MY WOUND CLOSURE?  °Contact your health care provider if:  °You have a fever.  °You have chills.  °You have drainage, redness, swelling, or pain at your wound.  °There is a bad smell coming from your wound.  °The skin edges of your wound start to separate after your sutures have been removed.  °Your wound becomes thick, raised, and darker in color after your sutures come out (scarring). °This information is not intended to replace advice given to you by your health care provider. Make sure you discuss any questions you have with your health care provider.  °Document Released: 07/13/2001 Document Revised: 11/08/2014 Document Reviewed: 03/27/2014  °Elsevier Interactive Patient Education ©2016 [employer]  ° °

## 2016-01-21 NOTE — ED Provider Notes (Signed)
Asked to assist with laceration repair.  LACERATION REPAIR Performed by: Ilda Foil, PA-S, under direct supervision Authorized by: Montine Circle Consent: Verbal consent obtained. Risks and benefits: risks, benefits and alternatives were discussed Consent given by: patient Patient identity confirmed: provided demographic data Prepped and Draped in normal sterile fashion Wound explored  Laceration Location: scalp  Laceration Length: 4 cm  No Foreign Bodies seen or palpated  Anesthesia: local infiltration  Local anesthetic: lidocaine 2% with epinephrine  Anesthetic total: 4 ml  Irrigation method: syringe Amount of cleaning: standard  Skin closure: staples  Number of sutures: 5  Technique: staples  Patient tolerance: Patient tolerated the procedure well with no immediate complications.   Montine Circle, PA-C 01/21/16 [place], MD 01/21/16 1534

## 2016-02-03 ENCOUNTER — Ambulatory Visit: Payer: Medicare Other | Attending: Neurology | Admitting: Occupational Therapy

## 2016-02-03 ENCOUNTER — Encounter: Payer: Self-pay | Admitting: Occupational Therapy

## 2016-02-03 DIAGNOSIS — R278 Other lack of coordination: Secondary | ICD-10-CM | POA: Diagnosis present

## 2016-02-03 DIAGNOSIS — G243 Spasmodic torticollis: Secondary | ICD-10-CM | POA: Diagnosis present

## 2016-02-03 DIAGNOSIS — R29818 Other symptoms and signs involving the nervous system: Secondary | ICD-10-CM | POA: Diagnosis present

## 2016-02-03 DIAGNOSIS — R29898 Other symptoms and signs involving the musculoskeletal system: Secondary | ICD-10-CM | POA: Diagnosis not present

## 2016-02-03 DIAGNOSIS — R2681 Unsteadiness on feet: Secondary | ICD-10-CM

## 2016-02-03 DIAGNOSIS — M6281 Muscle weakness (generalized): Secondary | ICD-10-CM | POA: Diagnosis present

## 2016-02-03 NOTE — Therapy (Signed)
[facility] [address] [place], [place], [address] Phone: [phone]   Fax:  [phone]  Occupational Therapy Evaluation  Patient Details  Name: Miranda [name] MRN: [id number] Date of Birth: [birth year] Referring Provider: Dr. Roosvelt Harps Pantelyat  Encounter Date: 02/03/2016      OT End of Session - 02/03/16 2022    Visit Number 1   Number of Visits 9   Date for OT Re-Evaluation 04/03/26   Authorization Type Medicare, [employer]; g-code needed   Authorization - Visit Number 1   Authorization - Number of Visits 9   OT Start Time [id number]   OT Stop Time 1412   OT Time Calculation (min) 54 min   Activity Tolerance --  responded appropriately when requested, but eyes closed at times, decr communication limited participation   Behavior During Therapy Impulsive      Past Medical History  Diagnosis Date  . Breast cancer ([place]) 1998    lumpectomy  . Parkinson's disease ([place]) 2012  . Depression   . Hypertension   . 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 Multiple falls     Past Surgical History  Procedure Laterality Date  . Lumpectomy right Right 1998    stage IA, DCIS with micrinvasion, ER+/PR+, , right breast lower inner quadrant  . Cesarean section  1975  . Anterior release vertebral body w/ posterior fusion  2010    L1-S1  . Spinal fusion      There were no vitals filed for this visit.  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 No/denies           [facility] OT Assessment - 02/03/16 0001    Assessment   Diagnosis PSP   Referring Provider Dr. Roosvelt Harps Pantelyat   Onset Date --  husband reports decline since hospitalization 10/19/16   Precautions   Precautions Fall   Precaution Comments nector-thick; straw in front of month   Balance Screen   Has the patient fallen in the past 6 months Yes   How many times? multiple   approx 10 in last month   Has the patient had a decrease in activity level because of a fear of falling?  Yes   Home  Environment   Family/patient expects to be discharged to: Private residence   Additional Comments 4 days a week for 2.5 hrs to assist with ADLs, and 4 hrs in the afternoon on Friday for respite care   Lives With Spouse   Prior Function   Level of Independence Needs assistance with ADLs;Needs assistance with transfers   Leisure does lumosity on the computer   ADL   Eating/Feeding Moderate assistance  husband feeds pt pasta, salad   Eating/Feeding pt has difficulty/needs assist to put things in mouth (food, lemon drop, pills) stops at mouth (may  freeze per husbands description), needs assist for tipping cup all the way up to drink, diffiiculty using utensils (but better than with fingers), difficulty manipulating sandwich   Grooming Supervision/safety  set-up w/ electric toothbrush   Grooming details difficulty rolling hair, brushes hair, puts on make-up independently   Upper Body Bathing Minimal assistance  but can tip over when bathing   Lower Body Bathing Supervision/safety   Upper Body Dressing --  mod-max A   Upper Body Dressing Details can help pull shirt down and raise arms   Lower Body Dressing --  mod-max A   Lower Body Dressing Details can pick up feet to put in  pant and pull up once assisted to standing   Toilet Tranfer Maximal assistance   Toileting - Clothing Manipulation Moderate assistance  needs A to stand   Toileting -  Hygiene Modified Independent;Supervision/safety   Tub/Shower Transfer Maximal assistance  tub   ADL comments has shower seat that pt does not use, transport w/c, and manual w/c   IADL   Meal Prep Needs to have meals prepared and served   [employer] on family or friends for transportation   Medication Management --  needs assistance   Financial Management Requires assistance  does some on the computer   Mobility   Mobility Status History of falls  approx 10 falls in last month, injuries with falls   Mobility Status Comments needs significant assist in w/c, to stand, to move in the bed, and for transfers.  Pt able to reposition herself in the w/c.  Pt falls from sitting position   Written Expression   Dominant Hand Left   Vision - History   Baseline Vision Wears glasses all the time   Additional Comments Husband reports that pt has opthalmologist visit soon with MD that specializes in Parkinsonisms.  Pt/husband report that pt has difficulty seeing computer.  Pt able to use large print communication board.   Cognition   Overall Cognitive Status Impaired/Different from baseline   Area of Impairment Safety/judgement   Safety/Judgement Decreased awareness of safety  stands alone when she shouldn't due to fall risk   Safety and Judgement Comments Pt able to communicate using communication board to spell out responses/comments or for yes/no responses.  Pt does need cues to slow down at times.   Behaviors Impulsive   Observation/Other Assessments   Observations Pt leans to the right (significantly with head positioned to the R in lateral flexion, forward lean.  (? dystonia)   Coordination   Tremors none noted   Other Difficulty manipulating utensils.  Pt able to use fingers on communication board with good  accuracy but due to timing deficits and hypokinesia, pt demo some difficulty with going too fast or not fully hitting target when she uses board for longer responses.  Husband reports that pt gets food to the mouth but stops without getting something in the mouth--? freezing.   Coordination to be assessed further in functional context   Praxis   Praxis --   Tone   Assessment Location Right Upper Extremity;Left Upper Extremity   ROM / Strength   AROM / PROM / Strength AROM;PROM   AROM   Overall AROM  Within functional limits for tasks performed   Overall AROM Comments BUEs except L 2nd digit PIP flex contracture; neck ROM limited approx 50%   PROM   Overall PROM  Within functional limits for tasks performed   Overall PROM Comments BUEs execpt L 2nd digit  with PIP contracture due to hx of fx due to fall   Hand Function   Comment hands held in closed position/posture (bradykinesia)   RUE Tone   RUE Tone Moderate   LUE Tone   LUE Tone Moderate                         OT Education - 02/03/16 1725    Education provided Yes   Education Details Recommended cutting sandwiches into bite size pieces and try using fork; Recommended using nosey cup (will order for trial in therapy); Recommended trial use of soft neck collar to assist with positioning   Person(s) Educated Patient;Spouse   Methods Explanation   Comprehension Verbalized understanding          OT Short Term Goals - 02/03/16 2041    OT SHORT TERM GOAL #1   Title Pt/husband will verbalize understanding of HEP.--check STGs 03/02/16   Time 4   Period Weeks   Status New   OT SHORT TERM GOAL #2   Title Pt will demo ability to drink with supervision/set-up at least 25% of the time.   Time 4   Period Weeks   Status New   OT SHORT TERM GOAL #3   Title Pt will demo ability to but food in mouth at least 50% of the time using fingers.   Time 4   Period Weeks   Status New           OT Long Term Goals -  02/03/16 2038    OT LONG TERM GOAL #1   Title Pt/husband will verbalize understanding of AE/DME and strategies to incr safety, ease, and independence with ADLs.--check LTGs 04/03/16   Time 8   Period Weeks   Status New   OT LONG TERM GOAL #2   Title Pt will demo ability to drink with supervision/set-up at least 50% of the time.   Time 8   Period Weeks   Status New   OT LONG TERM GOAL #3   Title Pt will demo ability to but food in mouth at least 75% of the time using fingers.   Time 8   Period Weeks   Status New   OT LONG TERM GOAL #4   Title Pt will demo ability to put on glasses at least 75% of the time without assistance.   Time 8   Period Weeks   Status New               Plan - 02/03/16 2025    Clinical Impression Statement Pt is a 70 y.o. female diagnosed with Progressive supranuclear palsy.  Pt with PMH that includes multiple falls with injuries including interventricular hemorrhage and cervical fx 10/19/16; hx of L 2nd digit fx with contracture; hx of breast CA.  Pt presents with hypokinesia, decr timing of movement, decr coordination, impulsivity, decr balance/functional mobility for ADLs, rigidity.  Pt would benefit from occupational therapy to address these deficits to incr ease/independence with ADLs, reduce caregiver burden, improve safety, improve quality of life, and prevent other complications.   Pt will benefit from skilled therapeutic intervention in order to improve on the following deficits (Retired) Decreased safety awareness;Decreased cognition;Decreased mobility;Impaired tone;Decreased strength;Decreased activity tolerance;Impaired UE functional use;Decreased knowledge of use of DME;Decreased balance;Decreased coordination;Improper spinal/pelvic alignment  hypokinesia   Rehab Potential Fair   OT Frequency 1x / week   OT Duration 8 weeks  +eval   OT Treatment/Interventions Self-care/ADL training;Therapeutic exercise;Functional  Mobility Training;Patient/family  education;Ultrasound;Neuromuscular education;Manual Therapy;Splinting;Therapeutic exercises;Therapeutic activities;DME and/or AE instruction;Parrafin;Cryotherapy;Electrical Stimulation;Moist Heat;Fluidtherapy;Contrast Bath;Passive range of motion;Cognitive remediation/compensation;Visual/perceptual remediation/compensation;Energy conservation   Plan assess self-feeding, drinking; will order nosey cup and trial when it comes in   [place] Education provided:  Mar 02, 2016 initated recommendations/strategies for eating   Recommended Other Services Husband reports that they are going to request PT referral to address neck positioning/dystonia   Consulted and Agree with Plan of Care Patient;Family member/caregiver   Family Member Consulted husband          G-Codes - 03-02-16 03-01-43    Functional Assessment Tool Used eating ability   Functional Limitation Self care   Self Care Current Status [phone]) At least 60 percent but less than 80 percent impaired, limited or restricted   Self Care Goal Status [id number]) At least 40 percent but less than 60 percent impaired, limited or restricted      Problem List Patient Active Problem List   Diagnosis Date Noted  . Fall 10/22/2015  . Scalp laceration 10/22/2015  . Multiple fractures of cervical spine ([place]) 10/22/2015  . Parkinson's disease ([place]) 10/22/2015  . TBI (traumatic brain injury) ([place]) 10/20/2015  . Breast cancer, right breast ([place]) 11/20/2012    [facility] 03/02/16, 8:50 PM  [facility] [address] [place], [place], [address] Phone: [phone]   Fax:  [phone]  Name: Miranda [name] MRN: [id number] Date of Birth: 16-Sep-1946  Vianne Bulls, OTR/L [facility] [address] [place], [place]  [address] [phone] phone [phone] 03-02-16 8:51 PM

## 2016-02-10 ENCOUNTER — Ambulatory Visit: Payer: Medicare Other | Admitting: Occupational Therapy

## 2016-02-10 DIAGNOSIS — R29898 Other symptoms and signs involving the musculoskeletal system: Secondary | ICD-10-CM | POA: Diagnosis not present

## 2016-02-10 DIAGNOSIS — R29818 Other symptoms and signs involving the nervous system: Secondary | ICD-10-CM

## 2016-02-10 DIAGNOSIS — R278 Other lack of coordination: Secondary | ICD-10-CM

## 2016-02-10 NOTE — Therapy (Signed)
[facility] [address] [place], [place], [address] Phone: [phone]   Fax:  [phone]  Occupational Therapy Treatment  Patient Details  Name: Miranda [name] MRN: [id number] Date of Birth: 11/08/1945 Referring Provider: Dr. Roosvelt Harps Pantelyat  Encounter Date: 02/10/2016      OT End of Session - 02/10/16 1250    Visit Number 2   Number of Visits 9   Date for OT Re-Evaluation 04/03/26   Authorization Type Medicare, [employer]; g-code needed   Authorization - Visit Number 2   Authorization - Number of Visits 9   OT Start Time 1155   OT Stop Time 1235   OT Time Calculation (min) 40 min   Activity Tolerance --  responded appropriately when requested, but eyes closed at times, decr communication limited participation   Behavior During Therapy [facility] for tasks assessed/performed      Past Medical History  Diagnosis Date  . Breast cancer ([place]) 1998    lumpectomy  . Parkinson's disease ([place]) 2012  . Depression   . Hypertension   . 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 Multiple falls     Past Surgical History  Procedure Laterality Date  . Lumpectomy right Right 1998    stage IA, DCIS with micrinvasion, ER+/PR+, , right breast lower inner quadrant  . Cesarean section  1975  . Anterior release vertebral body w/ posterior fusion  2010    L1-S1  . Spinal fusion      There were no vitals filed for this visit.      Subjective Assessment - 02/10/16 1302    Subjective  Pt able to answer simple questions using communication board.   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 No/denies         Practiced eating  sandwich and drinking.  Attempted drinking with straw, but incr difficulty with maintaining suction needed.  Instructed pt/husband in strategies to improve safety/independence--see pt instructions for recommendations.  Pt demo improvement with strategies.                       OT Education - 02/10/16 1306    Education Details Eating strategies--see pt instructions   Person(s) Educated Patient   Methods Explanation;Demonstration;Verbal cues   Comprehension Verbalized understanding;Returned demonstration          OT Short Term Goals - 02/03/16 2041    OT SHORT TERM GOAL #1   Title Pt/husband will verbalize understanding of HEP.--check STGs 03/02/16   Time 4   Period Weeks   Status New   OT SHORT TERM GOAL #2   Title Pt will demo ability to drink with supervision/set-up at least 25% of the time.   Time 4   Period Weeks   Status New   OT SHORT TERM GOAL #3   Title Pt will demo ability to but food in mouth at least 50% of the time using fingers.   Time 4   Period Weeks   Status New           OT Long Term Goals - 02/03/16 2038    OT LONG TERM GOAL #1   Title Pt/husband will verbalize understanding of  AE/DME and strategies to incr safety, ease, and independence with ADLs.--check LTGs 04/03/16   Time 8   Period Weeks   Status New   OT LONG TERM GOAL #2   Title Pt will demo ability to drink with supervision/set-up at least 50% of the time.   Time 8   Period Weeks   Status New   OT LONG TERM GOAL #3   Title Pt will demo ability to but food in mouth at least 75% of the time using fingers.   Time 8   Period Weeks   Status New   OT LONG TERM GOAL #4   Title Pt will demo ability to put on glasses at least 75% of the time without assistance.   Time 8   Period Weeks   Status New               Plan - 02/10/16 1300    Clinical Impression Statement Pt demo improved ability to self feed with cueing/strategies today.  Pt is progressing towards goals.   Plan  trial nosey cup if available, continue with strategies with eating   OT Home Exercise Plan Education provided:  02/03/16 initated recommendations/strategies for eating   Consulted and Agree with Plan of Care Patient;Family member/caregiver   Family Member Consulted husband      Patient will benefit from skilled therapeutic intervention in order to improve the following deficits and impairments:     Visit Diagnosis: Other symptoms and signs involving the nervous system  Other symptoms and signs involving the musculoskeletal system  Other lack of coordination    Problem List Patient Active Problem List   Diagnosis Date Noted  . Fall 10/22/2015  . Scalp laceration 10/22/2015  . Multiple fractures of cervical spine ([place]) 10/22/2015  . Parkinson's disease ([place]) 10/22/2015  . TBI (traumatic brain injury) ([place]) 10/20/2015  . Breast cancer, right breast ([place]) 11/20/2012    [facility] 02/10/2016, 1:14 PM  [facility] [address] [place], [place], [address] Phone: [phone]   Fax:  [phone]  Name: Miranda [name] MRN: [id number] Date of Birth: 05-08-1946  Vianne Bulls, OTR/L [facility] [address] [place], [place]  [address] [phone] phone [phone] 02/10/2016 1:14 PM

## 2016-02-10 NOTE — Patient Instructions (Signed)
Eating Recommendations:  Cut sandwiches into bite size pieces  Have her sit in chair with arms when eating and use towels for improved positioning/posture  Put food back in plate and start again if diffiuclty getting bite in mouth (freezing)  Cue pt for big movements  Recommended pt pick up glass with R hand and use L hand under bottom to help with tilt

## 2016-02-16 ENCOUNTER — Ambulatory Visit: Payer: Medicare Other | Admitting: Occupational Therapy

## 2016-02-16 DIAGNOSIS — R29898 Other symptoms and signs involving the musculoskeletal system: Secondary | ICD-10-CM

## 2016-02-16 DIAGNOSIS — R29818 Other symptoms and signs involving the nervous system: Secondary | ICD-10-CM

## 2016-02-16 DIAGNOSIS — R278 Other lack of coordination: Secondary | ICD-10-CM

## 2016-02-16 NOTE — Therapy (Signed)
[facility] [address] [place], [place], [address] Phone: [phone]   Fax:  [phone]  Occupational Therapy Treatment  Patient Details  Name: Miranda [name] MRN: [id number] Date of Birth: [birth year] Referring Provider: Dr. Roosvelt Harps Pantelyat  Encounter Date: 02/16/2016      OT End of Session - 02/16/16 1311    Visit Number 3   Number of Visits 9   Date for OT Re-Evaluation 04/03/26   Authorization Type Medicare, [employer]; g-code needed   Authorization - Visit Number 3   Authorization - Number of Visits 9   OT Start Time 1151   OT Stop Time 1235   OT Time Calculation (min) 44 min   Activity Tolerance Patient tolerated treatment well   Behavior During Therapy [facility] for tasks assessed/performed      Past Medical History  Diagnosis Date  . Breast cancer ([place]) 1998    lumpectomy  . Parkinson's disease ([place]) 2012  . Depression   . Hypertension   . 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 Multiple falls     Past Surgical History  Procedure Laterality Date  . Lumpectomy right Right 1998    stage IA, DCIS with micrinvasion, ER+/PR+, , right breast lower inner quadrant  . Cesarean section  1975  . Anterior release vertebral body w/ posterior fusion  2010    L1-S1  . Spinal fusion      There were no vitals filed for this visit.      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 No/denies         Pt ate applesauce with spoon with good  success.  Then after stretching, demo incr ease with using LUE due to more upright posture.  Husband reports that they are using towels to help with positioning of trunk and posture (as instructed last session) and cutting sandwiches into bite sized pieces with good success.     Reviewed and cued pt to bring object (food, glasses, cup, etc) all the way from mouth and re-start big if she freezes at her mouth.  Pt returned demo and husband verbalized understanding.    Drinking with min cueing and occasional min guidance/tactile cues for cup tilt with improvement noted.  After stretching, pt demo incr ease with drinking.    Therapist provided facilitation/gentle stretch at shoulders for scapular retraction and posture.  Then facilitation on L ribcage for trunk elongation with L shoulder in full flex.  Gentle neck stretch in ext and to the R to midline as able with pt assist.  Then pt actively instructed to turn to each side and then look up for AROM as able.  Improved self feeding/drinking after stretching today.  Pt/husband instructed in how to do this at home.   Pt is awaiting PT evaluation to address posture/neck dystonia further.    Recommended pt hold spoon in middle of handle and using visual marking for this and for brush at home.  Husband verbalized understanding.  (pt holds utensils and brushes at  the end due to bradykinesia making use difficult).                     OT Education - 02/16/16 1629    Education Details Gentle stretching for posture, head (correction with pt assist) by husband (bring shoulders in retraction and head up and to the right gently); pt performing AROM turning to targets (side to side) and raising head up  husband returned demo   Person(s) Educated Patient;Spouse   Methods Explanation;Demonstration;Verbal cues;Tactile cues   Comprehension Verbalized understanding;Returned demonstration;Verbal cues required          OT Short Term Goals -  02/03/16 2041    OT SHORT TERM GOAL #1   Title Pt/husband will verbalize understanding of HEP.--check STGs 03/02/16   Time 4   Period Weeks   Status New   OT SHORT TERM GOAL #2   Title Pt will demo ability to drink with supervision/set-up at least 25% of the time.   Time 4   Period Weeks   Status New   OT SHORT TERM GOAL #3   Title Pt will demo ability to but food in mouth at least 50% of the time using fingers.   Time 4   Period Weeks   Status New           OT Long Term Goals - 02/03/16 2038    OT LONG TERM GOAL #1   Title Pt/husband will verbalize understanding of AE/DME and strategies to incr safety, ease, and independence with ADLs.--check LTGs 04/03/16   Time 8   Period Weeks   Status New   OT LONG TERM GOAL #2   Title Pt will demo ability to drink with supervision/set-up at least 50% of the time.   Time 8   Period Weeks   Status New   OT LONG TERM GOAL #3   Title Pt will demo ability to but food in mouth at least 75% of the time using fingers.   Time 8   Period Weeks   Status New   OT LONG TERM GOAL #4   Title Pt will demo ability to put on glasses at least 75% of the time without assistance.   Time 8   Period Weeks   Status New               Plan - 02/16/16 1313    Clinical Impression Statement Pt/husband reports improved ability to self feed with strategies.  Pt is progressing towards goals.   Plan trial nosey cup if available, continue with strategies for eating, gentle stretches for neck/posture for improved alignment in prep for eating   OT Home Exercise Plan Education provided:  02/03/16 initated recommendations/strategies for eating      Patient will benefit from skilled therapeutic intervention in order to improve the following deficits and impairments:     Visit Diagnosis: Other symptoms and signs involving the nervous system  Other symptoms and signs involving the musculoskeletal system  Other lack of coordination    Problem  List Patient Active Problem List   Diagnosis Date Noted  . Fall 10/22/2015  . Scalp laceration 10/22/2015  . Multiple fractures of cervical spine ([place]) 10/22/2015  . Parkinson's disease ([place]) 10/22/2015  . TBI (traumatic brain injury) ([place]) 10/20/2015  . Breast cancer, right breast [facility]) 11/20/2012    [facility] 02/16/2016, 4:50 PM  [facility] [address] [place], [place], [address] Phone: [phone]   Fax:  [place]  Name: Joclyn Delion MRN: [id number] Date of Birth: [birth year]  Vianne Bulls, OTR/L [facility] [address] [place], [place]  [address] [phone] phone [phone] 02/16/2016 4:50 PM

## 2016-02-24 ENCOUNTER — Ambulatory Visit: Payer: Medicare Other | Admitting: Occupational Therapy

## 2016-02-24 DIAGNOSIS — R29898 Other symptoms and signs involving the musculoskeletal system: Secondary | ICD-10-CM | POA: Diagnosis not present

## 2016-02-24 DIAGNOSIS — R278 Other lack of coordination: Secondary | ICD-10-CM

## 2016-02-24 DIAGNOSIS — R29818 Other symptoms and signs involving the nervous system: Secondary | ICD-10-CM

## 2016-02-24 NOTE — Therapy (Addendum)
[place] [address] [place], [place], [address] Phone: [phone]   Fax:  [phone]  Occupational Therapy Treatment  Patient Details  Name: Miranda [name] MRN: [id number] Date of Birth: 04-Jan-1946 Referring Provider: Dr. Roosvelt Harps Pantelyat  Encounter Date: 02/24/2016      OT End of Session - 02/24/16 1628    Visit Number 4   Number of Visits 9   Date for OT Re-Evaluation 04/03/26   Authorization Type Medicare, [employer]; g-code needed   Authorization - Visit Number 4   Authorization - Number of Visits 9   OT Start Time 1410   OT Stop Time 1453   OT Time Calculation (min) 43 min   Activity Tolerance Patient tolerated treatment well   Behavior During Therapy [facility] for tasks assessed/performed      Past Medical History  Diagnosis Date  . Breast cancer ([place]) 1998    lumpectomy  . Parkinson's disease ([place]) 2012  . Depression   . Hypertension   . 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 Multiple falls     Past Surgical History  Procedure Laterality Date  . Lumpectomy right Right 1998    stage IA, DCIS with micrinvasion, ER+/PR+, , right breast lower inner quadrant  . Cesarean section  1975  . Anterior release vertebral body w/ posterior fusion  2010    L1-S1  . Spinal fusion      There were no vitals filed for this visit.      Subjective Assessment - 02/24/16 1614    Subjective  Husband/pt report improved ability to use fork/spoon and good sucess if cued for freezing.  Cutting sandwiches into bite-sized pieces also helps.   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 No/denies  Initially, but reports headache (back of head) at end of treatment        Gentle PROM and AAROM to neck to midline as able.  Followed by AROM neck extension and then rotation to each side.  Gentle facilitation PROM/AAROM scapular retraction followed by AROM.  Then actively, L lateral trunk extension with LUE shoulder flex and trying to look at hand as able, followed by R shoulder ER/abduction with trunk rotation and head turn to hand as able.  Pt reports headache in back of head by end of session, but reports that this happens every afternoon and she feels that it is due to holding head up.  Cautioned pt/husband to stretch gently for short periods of time to decr risk of pain/injury.  Pt/husband verbalized understanding.  Recommended pt lay flat in bed with UEs in mid-range abduction for gravity assisted trunk/shoulder stretch.  Pt/husband verbalized understanding.  Attempted drinking, but pt needed min A to tilt cup and appeared to freeze with mouth movements for drinking.    Recommended that pt try ringing with other hand when freezing to notify husband to assist/cue pt and reviewed freezing strategies for eating and educated husband/pt that husband will need to cue pt as she may not be able to utilize strategy independently.  Pt/husband verbalized understanding.  OT Education - 02/24/16 1616    Education provided Yes   Education Details Cautioned pt/husband against stretching too much or doing UE weights due to positioning of shoulders/posture increasing risk of injury; PSP Symposium in [facility]) Educated Patient;Spouse   Methods Explanation;Demonstration;Verbal cues   Comprehension Verbalized understanding          OT Short Term Goals - 02/24/16 1631    OT SHORT TERM GOAL #1   Title Pt/husband will verbalize understanding of HEP.--check STGs 03/02/16   Time 4   Period Weeks   Status On-going   OT SHORT TERM GOAL #2    Title Pt will demo ability to drink with supervision/set-up at least 25% of the time.   Time 4   Period Weeks   Status New   OT SHORT TERM GOAL #3   Title Pt will demo ability to but food in mouth at least 50% of the time using fingers.   Time 4   Period Weeks   Status New           OT Long Term Goals - 02/03/16 2038    OT LONG TERM GOAL #1   Title Pt/husband will verbalize understanding of AE/DME and strategies to incr safety, ease, and independence with ADLs.--check LTGs 04/03/16   Time 8   Period Weeks   Status New   OT LONG TERM GOAL #2   Title Pt will demo ability to drink with supervision/set-up at least 50% of the time.   Time 8   Period Weeks   Status New   OT LONG TERM GOAL #3   Title Pt will demo ability to but food in mouth at least 75% of the time using fingers.   Time 8   Period Weeks   Status New   OT LONG TERM GOAL #4   Title Pt will demo ability to put on glasses at least 75% of the time without assistance.   Time 8   Period Weeks   Status New               Plan - 02/24/16 1629    Clinical Impression Statement Pt is progressing towards goals, but continues to demo difficulty with tilting cup to drink and freezing with eating/drinking.   Plan check STGs, trial nosey cup if available   OT Home Exercise Plan Education provided:  02/03/16 initated recommendations/strategies for eating   Consulted and Agree with Plan of Care Patient;Family member/caregiver   Family Member Consulted husband      Patient will benefit from skilled therapeutic intervention in order to improve the following deficits and impairments:     Visit Diagnosis: Other symptoms and signs involving the nervous system  Other symptoms and signs involving the musculoskeletal system  Other lack of coordination    Problem List Patient Active Problem List   Diagnosis Date Noted  . Fall 10/22/2015  . Scalp laceration 10/22/2015  . Multiple fractures of cervical spine ([place])  10/22/2015  . Parkinson's disease ([place]) 10/22/2015  . TBI (traumatic brain injury) ([place]) 10/20/2015  . Breast cancer, right breast [facility]) 11/20/2012    [facility] 02/24/2016, 4:56 PM  [place] [address] [place], [place], [address] Phone: [phone]   Fax:  [phone]  Name: Miranda [name] MRN: [id number] Date of Birth: 05/24/1946  Vianne Bulls, OTR/L [facility] [address] [place], [place]  [address] [phone] phone [phone] 02/24/2016 4:56 PM

## 2016-02-26 ENCOUNTER — Ambulatory Visit: Payer: Medicare Other | Admitting: Physical Therapy

## 2016-02-26 DIAGNOSIS — G243 Spasmodic torticollis: Secondary | ICD-10-CM

## 2016-02-26 DIAGNOSIS — R29898 Other symptoms and signs involving the musculoskeletal system: Secondary | ICD-10-CM

## 2016-02-26 DIAGNOSIS — R29818 Other symptoms and signs involving the nervous system: Secondary | ICD-10-CM

## 2016-02-27 ENCOUNTER — Ambulatory Visit: Payer: Medicare Other

## 2016-02-27 NOTE — Therapy (Signed)
[place] [address] [place], [place], [address] Phone: [phone]   Fax:  [phone]  Physical Therapy Evaluation  Patient Details  Name: Miranda [name] MRN: [id number] Date of Birth: 06/12/1946 Referring Provider: Pantelyat  Encounter Date: 02/26/2016      PT End of Session - 02/27/16 2100    Visit Number 1   Number of Visits 5   Date for PT Re-Evaluation 04/26/16   Authorization Type Medicare   PT Start Time 1320   PT Stop Time 1407   PT Time Calculation (min) 47 min   Activity Tolerance Patient tolerated treatment well   Behavior During Therapy [facility] for tasks assessed/performed      Past Medical History  Diagnosis Date  . Breast cancer ([place]) 1998    lumpectomy  . Parkinson's disease ([place]) 2012  . Depression   . Hypertension   . 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 Multiple falls     Past Surgical History  Procedure Laterality Date  . Lumpectomy right Right 1998    stage IA, DCIS with micrinvasion, ER+/PR+, , right breast lower inner quadrant  . Cesarean section  1975  . Anterior release vertebral body w/ posterior fusion  2010    L1-S1  . Spinal fusion      There were no vitals filed for this visit.       Subjective Assessment - 02/26/16 1324    Subjective Husband present with pt today for evaluation for assistance with postural concerns.  Pt reports pt's posture has improved slightly overthe past few weeks.   Patient is accompained by: Family member  Husband   Pertinent History Vertebrae fractures in neck s/p fall   Limitations Other (comment)  Feeding   Patient Stated Goals Husband reports main goal is to address neck, improved posture   Currently in Pain? No/denies  Occasional headaches usually 4 pm each day            [facility] PT Assessment - 02/26/16 1328    Assessment   Medical Diagnosis PSP   Referring Provider Pantelyat   Precautions   Precautions Fall  Uses  communication board   Precaution Comments nector-thick; straw in front of month   Balance Screen   Has the patient fallen in the past 6 months Yes  fell out of chair this morning   How many times? 75  usually from couch/sitting position to floor   Has the patient had a decrease in activity level because of a fear of falling?  No   Is the patient reluctant to leave their home because of a fear of falling?  No   Home Ecologist residence   Living Arrangements Spouse/significant other   Available Help at Discharge Family  Caregiver 5 days/wk   Type of [place] - manual;Shower seat   Prior Function   Level of Independence Needs assistance with ADLs;Needs assistance with transfers   Leisure Personal trainer 2x/wk   Comments ACT stretching of legs and strengthening of legs   Posture/Postural Control   Posture/Postural Control Postural limitations   Postural Limitations Rounded Shoulders;Forward head;Weight shift left;Posterior pelvic tilt   Posture Comments Pt sits with extreme forward flexed neck posture with lateral lean of neck to the left.     Tone   Assessment Location Other (comment)  increased tone noted in neck with  attempts at ROM   Tone Assessment - Other   Other Tone Location neck musculature   ROM / Strength   AROM / PROM / Strength PROM;AROM   AROM   Overall AROM Comments Active ROM neck extension 58 degrees from neutral, active neck lateral flexion 20 degrees from neutral.   PROM   Overall PROM  Deficits   Overall PROM Comments Significantly decreased cervical spine ROM passively and actively  Pt does not c/o pain during ROM of eval   PROM Assessment Site Cervical   Cervical Flexion Pt holds neck in 64 degrees of neck flexion  goniometer center at top of humerus, then to mid-ear lobe   Cervical Extension With P/ROM, pt able to achieve 58 degrees from  neutral neck extension   Cervical - Left Side Bend Pt holds neck in 26 degrees L lateral neck flexion.  Able to passively improve to 10 degrees from neutral at neck.   Transfers   Transfers Stand Pivot Transfers   Stand Pivot Transfers 1: +1 Total assist   Comments Pt goes into fully body extension, strong posterior lean during transfers.                   [facility] Adult PT Treatment/Exercise - 02/26/16 1328    Manual Therapy   Manual Therapy Other (comment);Passive ROM   Passive ROM Attempted supine positioning with 3 pillows to assist with P/ROM of neck into further extension and flexion.  Pt appears very guarded and unable relax neck to meet pillows with her head positioning.   Other Manual Therapy performed manual sidebending towards R x 8 reps for improved neutral positioning at neck.  Unable to have pt fully relax for manual techniques/stretching into neutral neck extension.                     PT Long Term Goals - 02/27/16 2106    PT LONG TERM GOAL #1   Title Pt's husband will verbalize/demonstrate understanding of HEP to assist with neck and shoulder stretching and ROM.  TARGET 03/27/16   Time 4   Status New   PT LONG TERM GOAL #2   Title Pt will report 1-2 point decrease in neck pain by day's end, demonstrating improved tolerance to ROM and improved positioning for participation in ADLs.   Time 4   Period Weeks   Status New   PT LONG TERM GOAL #3   Title Pt will demonstrate improved ROM in neck extension/lateral flexion by 10% for improved participation in ADLs.   Time 4   Period Weeks   Status New               Plan - 02/27/16 2102    Clinical Impression Statement Pt is a 70 year old female who presents to OP PT with history of Parkinsonism, PSP, with history of multiple falls, significant postural changes into extreme forward neck/head flexion (chin near chest) and left lateral flexion.  Pt has c/o pain near end of day due to trying to hold  head up.  Head and neck positioning is interfering with feeding, ADLs, communication.  Pt exhbits dystonia, muscle stiffness and tightness, decreased ROM, decreased flexibility, decreased safety awareness.   Rehab Potential Fair   Clinical Impairments Affecting Rehab Potential Progression of disease process   PT Frequency 1x / week   PT Duration 4 weeks   PT Treatment/Interventions ADLs/Self Care Home Management;Therapeutic exercise;Therapeutic activities;Neuromuscular re-education;Patient/family education;Manual techniques   PT Next Visit  Plan Posture, positioning, attempts with stretching/manual techniques to improve neck positioning in supine and in sitting; trial of neck brace?   Consulted and Agree with Plan of Care Patient;Family member/caregiver   Family Member Consulted Husband, Bill      Patient will benefit from skilled therapeutic intervention in order to improve the following deficits and impairments:  Decreased safety awareness, Decreased range of motion, Hypomobility, Impaired flexibility, Impaired tone, Postural dysfunction  Visit Diagnosis: Other symptoms and signs involving the nervous system  Cervical dystonia  Decreased ROM of neck      G-Codes - 03-10-2016 2109/02/12    Functional Assessment Tool Used pt holds neck in L lateral flexion of 26 degrees and neck flexion of 64 degrees   Functional Limitation Changing and maintaining body position   Changing and Maintaining Body Position Current Status [id number]) At least 80 percent but less than 100 percent impaired, limited or restricted   Changing and Maintaining Body Position Goal Status [id number]) At least 60 percent but less than 80 percent impaired, limited or restricted       Problem List Patient Active Problem List   Diagnosis Date Noted  . Fall 10/22/2015  . Scalp laceration 10/22/2015  . Multiple fractures of cervical spine ([place]) 10/22/2015  . Parkinson's disease ([place]) 10/22/2015  . TBI (traumatic brain injury) ([place])  10/20/2015  . Breast cancer, right breast ([place]) 11/20/2012    Delphina Schum W. Mar 10, 2016, 9:12 PM Frazier Butt., PT [place] [address] [place], [place], [address] Phone: [phone]   Fax:  [phone]  Name: Miranda [name] MRN: [id number] Date of Birth: 1946/10/02

## 2016-03-01 ENCOUNTER — Ambulatory Visit: Payer: Medicare Other | Attending: Neurology | Admitting: Occupational Therapy

## 2016-03-01 DIAGNOSIS — R278 Other lack of coordination: Secondary | ICD-10-CM | POA: Diagnosis present

## 2016-03-01 DIAGNOSIS — R29898 Other symptoms and signs involving the musculoskeletal system: Secondary | ICD-10-CM

## 2016-03-01 DIAGNOSIS — G243 Spasmodic torticollis: Secondary | ICD-10-CM | POA: Diagnosis present

## 2016-03-01 DIAGNOSIS — M6281 Muscle weakness (generalized): Secondary | ICD-10-CM

## 2016-03-01 DIAGNOSIS — R29818 Other symptoms and signs involving the nervous system: Secondary | ICD-10-CM

## 2016-03-02 ENCOUNTER — Encounter: Payer: Medicare Other | Admitting: Occupational Therapy

## 2016-03-02 NOTE — Therapy (Signed)
[place] [address] [place], [place], [address] Phone: [phone]   Fax:  [phone]  Occupational Therapy Treatment  Patient Details  Name: Miranda [name] MRN: [id number] Date of Birth: 1946/06/19 Referring Provider: Dr. Roosvelt Harps Pantelyat  Encounter Date: 03/01/2016      OT End of Session - 03/02/16 1322    Visit Number 5   Number of Visits 9   Date for OT Re-Evaluation 04/03/26   Authorization Type Medicare, [employer]; g-code needed   Authorization - Visit Number 5   Authorization - Number of Visits 9   OT Start Time 1537   OT Stop Time 1635   OT Time Calculation (min) 58 min   Activity Tolerance Patient tolerated treatment well   Behavior During Therapy [facility] for tasks assessed/performed      Past Medical History  Diagnosis Date  . Breast cancer ([place]) 1998    lumpectomy  . Parkinson's disease ([place]) 2012  . Depression   . Hypertension   . 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 Multiple falls     Past Surgical History  Procedure Laterality Date  . Lumpectomy right Right 1998    stage IA, DCIS with micrinvasion, ER+/PR+, , right breast lower inner quadrant  . Cesarean section  1975  . Anterior release vertebral body w/ posterior fusion  2010    L1-S1  . Spinal fusion      There were no vitals filed for this visit.      Subjective Assessment - 03/02/16 1059    Subjective  Husband reports improvement with eating, but drinking continues to be an issue.  Husband reports that pt has lost weight lately.   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 Yes   Pain  Score 7    Pain Location Head   Pain Orientation Posterior   Pain Descriptors / Indicators Aching   Pain Type Acute pain   Aggravating Factors  holding head up   Pain Relieving Factors lying down         Checked STGs and discussed progress.--see goals section below.  Drinking with nosey cup.  Pt demo incr ease and utilized freezing strategies with cueing (stop, bring cup down and re-start).  Pt with minimal A for tilting cup approx 50% of the time.  Pt appeared to freeze with oral motor movements for bringing water into mouth and had water leaking from L side of mouth at times.  Pt demo incr ability to lift cup appropriately and overshot mouth x2.  Pt had coughing episode once after drinking.   Recommended MBSS (swallow study) due to difficulty initiating swallowing and concerns with incr cervical dystonia and cervical fx with immobilization since last MBSS October 2016 and possible follow-up with speech therapist for swallowing based on physician/speech therapist recommendations.  Husband reports that he will pursue with [facility] or [facility].  Also recommended use of soft collar with eating for improved positioning and requested husband bring collar to next session.  Discussed ways to incr nutrition and liquid safely.  Recommended thickened water/liquids throughout the day (pt reports incr ease when asked via communication board).  Also recommended  smoothies for consistency and incr nutrition.  Recommended nosey cup for incr ease with neck dystonia.    Practiced putting glasses on with freezing initially and overshooting movement.  Stop-restart big strategy with consistent success after initial freezing episode.                   OT Education - 03/02/16 1321    Education Details Use of nosey cup and where to purchase    Person(s) Educated Patient   Methods Handout;Explanation;Demonstration   Comprehension Verbalized understanding;Returned demonstration;Tactile cues  required          OT Short Term Goals - 03/01/16 1541    OT SHORT TERM GOAL #1   Title Pt/husband will verbalize understanding of HEP.--check STGs 03/02/16   Time 4   Period Weeks   Status Achieved   OT SHORT TERM GOAL #2   Title Pt will demo ability to drink with supervision/set-up at least 25% of the time.   Time 4   Period Weeks   Status Achieved  03/02/16 per pt/husband report   OT SHORT TERM GOAL #3   Title Pt will demo ability to but food in mouth at least 50% of the time using fingers.   Time 4   Period Weeks   Status Achieved  03/01/16--75% per pt/husband           OT Long Term Goals - 03/02/16 1324    OT LONG TERM GOAL #1   Title Pt/husband will verbalize understanding of AE/DME and strategies to incr safety, ease, and independence with ADLs.--check LTGs 04/03/16   Time 8   Period Weeks   Status On-going   OT LONG TERM GOAL #2   Title Pt will demo ability to drink with supervision/set-up at least 50% of the time.   Time 8   Period Weeks   Status New   OT LONG TERM GOAL #3   Title Pt will demo ability to but food in mouth at least 75% of the time using fingers.   Time 8   Period Weeks   Status Achieved  03/01/16 per pt/husband report   OT LONG TERM GOAL #4   Title Pt will demo ability to put on glasses at least 75% of the time without assistance.   Time 8   Period Weeks   Status New               Plan - 03/02/16 1323    Clinical Impression Statement Pt has met STGs and is progressing toward LTGs.  Pt appears to demo freezing with swallowing--Recommended pt/husband discuss with neurologist and recommended MBSS (swallow study)   Rehab Potential Fair   OT Frequency 1x / week   OT Duration 8 weeks  +eval   OT Treatment/Interventions Self-care/ADL training;Therapeutic exercise;Functional Mobility Training;Patient/family education;Ultrasound;Neuromuscular education;Manual Therapy;Splinting;Therapeutic exercises;Therapeutic activities;DME and/or AE  instruction;Parrafin;Cryotherapy;Electrical Stimulation;Moist Heat;Fluidtherapy;Contrast Bath;Passive range of motion;Cognitive remediation/compensation;Visual/perceptual remediation/compensation;Energy conservation   Plan trial drinking with soft collar, use thickened liquids for drinking, trial of placing straw further back in mouth, open mouth wider in prep for drinking, signs of Aspiration Pneumonia handout   OT Home Exercise Plan Education provided:  02/03/16 initated recommendations/strategies for eating   Consulted and Agree with Plan of Care Patient   Family Member Consulted husband      Patient will benefit from skilled therapeutic intervention in order to improve the following deficits and impairments:  Decreased safety awareness, Decreased cognition, Decreased mobility, Impaired tone, Decreased strength, Decreased activity tolerance, Impaired UE  functional use, Decreased knowledge of use of DME, Decreased balance, Decreased coordination, Improper spinal/pelvic alignment (hypokinesia, freezing)  Visit Diagnosis: Other symptoms and signs involving the nervous system  Other symptoms and signs involving the musculoskeletal system  Other lack of coordination  Muscle weakness (generalized)    Problem List Patient Active Problem List   Diagnosis Date Noted  . Fall 10/22/2015  . Scalp laceration 10/22/2015  . Multiple fractures of cervical spine ([place]) 10/22/2015  . Parkinson's disease ([place]) 10/22/2015  . TBI (traumatic brain injury) ([place]) 10/20/2015  . Breast cancer, right breast ([place]) 11/20/2012    [facility] 03/02/2016, 1:47 PM  [place] [address] [place], [place], [address] Phone: [phone]   Fax:  [phone]  Name: Miranda [name] MRN: [id number] Date of Birth: [birth year]  Vianne Bulls, OTR/L [facility] [address] [place], [place]  [address] [phone]  phone [phone] 03/02/2016 1:47 PM

## 2016-03-09 ENCOUNTER — Encounter: Payer: Medicare Other | Admitting: Occupational Therapy

## 2016-03-16 ENCOUNTER — Encounter: Payer: Medicare Other | Admitting: Occupational Therapy

## 2016-03-22 ENCOUNTER — Ambulatory Visit: Payer: Medicare Other | Admitting: Physical Therapy

## 2016-03-22 ENCOUNTER — Ambulatory Visit: Payer: Medicare Other | Admitting: Occupational Therapy

## 2016-03-22 DIAGNOSIS — R29818 Other symptoms and signs involving the nervous system: Secondary | ICD-10-CM | POA: Diagnosis not present

## 2016-03-22 DIAGNOSIS — R29898 Other symptoms and signs involving the musculoskeletal system: Secondary | ICD-10-CM

## 2016-03-22 DIAGNOSIS — M6281 Muscle weakness (generalized): Secondary | ICD-10-CM

## 2016-03-22 NOTE — Therapy (Signed)
[facility] [address] [place], [place], [address] Phone: [phone]   Fax:  [phone]  Occupational Therapy Treatment  Patient Details  Name: Miranda [name] MRN: [id number] Date of Birth: 01-26-1946 Referring Provider: Dr. Roosvelt Harps Pantelyat  Encounter Date: 03/22/2016      OT End of Session - 03/22/16 2017    Visit Number 6   Number of Visits 9   Date for OT Re-Evaluation 04/03/26   Authorization Type Medicare, [employer]; g-code needed   Authorization - Visit Number 6   Authorization - Number of Visits 9   OT Start Time [id number]   OT Stop Time 1617   OT Time Calculation (min) 39 min   Activity Tolerance Patient tolerated treatment well   Behavior During Therapy [facility] for tasks assessed/performed      Past Medical History  Diagnosis Date  . Breast cancer ([place]) 1998    lumpectomy  . Parkinson's disease ([place]) 2012  . Depression   . Hypertension   . 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 Multiple falls     Past Surgical History  Procedure Laterality Date  . Lumpectomy right Right 1998    stage IA, DCIS with micrinvasion, ER+/PR+, , right breast lower inner quadrant  . Cesarean section  1975  . Anterior release vertebral body w/ posterior fusion  2010    L1-S1  . Spinal fusion      There were no vitals filed for this visit.      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 --  no reports of pain on arrival per pt/ husband           Pt  transferred stand pivot to mat, with max A. Supine gentle neck and shoulder extension then lateral stretch to bring head to midline in prep for drinking.  PWR ! Twist in supine x10 with mod v.c./ facilitation. Shoulder flexion then diagonals with medium ball and bilateral UE's with trunk rotation, with mod facilitation, Sitting gentle AA/passive stretch in shoulder extension. Pt's husband was provided with the signs/symptoms of aspiration.                     OT Short Term Goals - 03/01/16 1541    OT SHORT TERM GOAL #1   Title Pt/husband will verbalize understanding of HEP.--check STGs 03/02/16   Time 4   Period Weeks   Status Achieved   OT SHORT TERM GOAL #2   Title Pt will demo ability to drink with supervision/set-up at least 25% of the time.   Time 4   Period Weeks   Status Achieved  03/02/16 per pt/husband report   OT SHORT TERM GOAL #3   Title Pt will demo ability to but food in mouth at least 50% of the time using fingers.   Time 4   Period Weeks   Status Achieved  03/01/16--75% per pt/husband           OT Long Term Goals - 03/02/16 1324    OT LONG TERM  GOAL #1   Title Pt/husband will verbalize understanding of AE/DME and strategies to incr safety, ease, and independence with ADLs.--check LTGs 04/03/16   Time 8   Period Weeks   Status On-going   OT LONG TERM GOAL #2   Title Pt will demo ability to drink with supervision/set-up at least 50% of the time.   Time 8   Period Weeks   Status New   OT LONG TERM GOAL #3   Title Pt will demo ability to but food in mouth at least 75% of the time using fingers.   Time 8   Period Weeks   Status Achieved  03/01/16 per pt/husband report   OT LONG TERM GOAL #4   Title Pt will demo ability to put on glasses at least 75% of the time without assistance.   Time 8   Period Weeks   Status New               Plan - 03/22/16 2018    Clinical Impression Statement Pt demonstrates significant rgidity and abnormal  posture with neck and trunkc flexion. Therapist addressed posture and UE ROM in prep for feedin as pt has been out of town and has not received therapy recently.   Rehab Potential Fair   OT Frequency 1x / week   OT Duration 8 weeks  +eval   OT Treatment/Interventions Self-care/ADL training;Therapeutic exercise;Functional Mobility Training;Patient/family education;Ultrasound;Neuromuscular education;Manual Therapy;Splinting;Therapeutic exercises;Therapeutic activities;DME and/or AE instruction;Parrafin;Cryotherapy;Electrical Stimulation;Moist Heat;Fluidtherapy;Contrast Bath;Passive range of motion;Cognitive remediation/compensation;Visual/perceptual remediation/compensation;Energy conservation   Plan consider placing tx on hold until after botox to neck, trial drinking with soft collar, thickened liquids for drinking, trial further back in mouth, open mouth wider   OT Home Exercise Plan Education provided:  02/03/16 initated recommendations/strategies for eating   Consulted and Agree with Plan of Care Patient   Family Member Consulted husband      Patient will benefit from skilled therapeutic intervention in order to improve the following deficits and impairments:  Decreased safety awareness, Decreased cognition, Decreased mobility, Impaired tone, Decreased strength, Decreased activity tolerance, Impaired UE functional use, Decreased knowledge of use of DME, Decreased balance, Decreased coordination, Improper spinal/pelvic alignment (hypokinesia, freezing)  Visit Diagnosis: Other symptoms and signs involving the nervous system  Other symptoms and signs involving the musculoskeletal system  Muscle weakness (generalized)    Problem List Patient Active Problem List   Diagnosis Date Noted  . Fall 10/22/2015  . Scalp laceration 10/22/2015  . Multiple fractures of cervical spine ([place]) 10/22/2015  . Parkinson's disease ([place]) 10/22/2015  . TBI (traumatic brain injury) ([place]) 10/20/2015  . Breast  cancer, right breast ([place]) 11/20/2012    RINE,KATHRYN 03/22/2016, 8:22 PM Theone Murdoch, OTR/L [phone] Phone: [phone] 8:22 PM 03/22/2016 [facility] [address] [place], [place], [address] Phone: [phone]   Fax:  [phone]  Name: Miranda [name] MRN: [id number] Date of Birth: [birth year]

## 2016-03-25 ENCOUNTER — Ambulatory Visit: Payer: Medicare Other | Admitting: Physical Therapy

## 2016-03-25 ENCOUNTER — Ambulatory Visit: Payer: Medicare Other

## 2016-03-25 ENCOUNTER — Encounter: Payer: Medicare Other | Admitting: Occupational Therapy

## 2016-03-25 DIAGNOSIS — G243 Spasmodic torticollis: Secondary | ICD-10-CM

## 2016-03-25 DIAGNOSIS — R29818 Other symptoms and signs involving the nervous system: Secondary | ICD-10-CM

## 2016-03-25 DIAGNOSIS — R29898 Other symptoms and signs involving the musculoskeletal system: Secondary | ICD-10-CM

## 2016-03-25 NOTE — Patient Instructions (Signed)
[place], Sitting    Sit, head in comfortable, centered position, chin slightly tucked. Bill: sit or stand behind [name] and place one hand on the left side of her head and place one hand on her top left shoulder.  Gently push your hands apart, as in gentle distraction.  Hold this position for 15-30 seconds, 3 repetitions.  Do this twice a day.     Bill:  Ask Miranda [name] to actively look up as far as she can.  With your hand on her forehead and other hand at her mid-upper back, gently help her hold this position for 5 seconds.  Repeat 3 times.  Twice per day.    Use the neck collar for about 30 minutes after each stretching session.  Copyright  VHI. All rights reserved.

## 2016-03-25 NOTE — Patient Instructions (Signed)
[place], Sitting    Sit, head in comfortable, centered position, chin slightly tucked. Bill: sit or stand behind [place] and place one hand on the left side of her head and place one hand on her top left shoulder.  Gently push your hands apart, as in gentle distraction.  Hold this position for 15-30 seconds, 3 repetitions.  Do this twice a day.     Bill:  Ask Davilyn to actively look up as far as she can.  With your hand on her forehead and other hand at her mid-upper back, gently help her hold this position for 5 seconds.  Repeat 3 times.  Twice per day.    Use the neck collar for about 30 minutes after each stretching session.  Copyright  VHI. All rights reserved.

## 2016-03-26 NOTE — Therapy (Signed)
[facility] [address] [place], [place], [address] Phone: [phone]   Fax:  [phone]  Physical Therapy Treatment  Patient Details  Name: Devanny Reissig MRN: [id number] Date of Birth: 1946/07/03 Referring Provider: Pantelyat  Encounter Date: 03/25/2016      PT End of Session - 03/26/16 1202    Visit Number 2   Number of Visits 5   Date for PT Re-Evaluation 04/26/16   Authorization Type Medicare   PT Start Time 1022   PT Stop Time 1111   PT Time Calculation (min) 49 min   Activity Tolerance Patient tolerated treatment well  No c/o pain during session   Behavior During Therapy [facility] for tasks assessed/performed      Past Medical History  Diagnosis Date  . Breast cancer ([place]) 1998    lumpectomy  . Parkinson's disease ([place]) 2012  . Depression   . Hypertension   . 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 Multiple falls     Past Surgical History  Procedure Laterality Date  . Lumpectomy right Right 1998    stage IA, DCIS with micrinvasion, ER+/PR+, , right breast lower inner quadrant  . Cesarean section  1975  . Anterior release vertebral body w/ posterior fusion  2010    L1-S1  . Spinal fusion      There were no vitals filed for this visit.      Subjective Assessment - 03/26/16 1156    Subjective Pt returns today following visit to [facility] and [place] in [place].  Brought in soft collar neck brace.   Patient is accompained by: Family member  Husband, Bill   Pertinent History Vertebrae fractures in neck s/p fall   Patient Stated Goals Husband reports main goal is to address neck, improved posture   Currently in Pain? No/denies            [facility] PT Assessment - 03/26/16 1157    PROM   Cervical - Left Side Bend Pt able to improve P/ROM neck lateral flexion to 10 degrees from neutral.               Therapeutic Activity:      [facility] Adult PT Treatment/Exercise -  03/26/16 1157    Transfers   Transfers Stand Pivot Transfers   Stand Pivot Transfers 1: +1 Total assist   Comments Pt needs verbal cues and assistance to prevent excessive lumbar extension.     Seated positioning with red therapy ball behind patient, with cues from therapist to facilitate improved lumbar positioning for upright posture.  MANUAL THERAPY: -Seated manual therapy provided for contract-relax techniques, with resistance 3 seconds into neck flexion, with relax 5 seconds for therapist to assist into more neutral neck positioning (less neck flexion); repeated at least 8 times. -Contract-relax techniques with resistance 3 seconds into L lateral neck flexion, with relax 5 seconds for therapist to assist into more neutral neck positioning (less lateral neck flexion on L) -Manual stretching to decrease L lateral flexion, 30 seconds x 3 reps -Manual stretching to decrease forward neck flexion 30 seconds x 5 reps -Gentle massage provided to upper traps x 2 minutes, 2 reps between stretching and contract/relax -No pain reported by patient  THERAPEUTIC EXERCISE: -Active lumbar flexion, then push off knees into open arms (scapular retraction) with cues for active upright neck posture, with 5 second hold. X 8 reps (similar to PWR! Up posture in sitting)-therapist assist to faciliate -With therapist assisting with upright posture-active  neck ROM against gravity, with 5 second hold -Instruction provided to pt's husband in lateral flexion stretch and in active neck ROM and 5 second hold against gravity   SELF CARE: -Discussed POC, given pt has missed several weeks of therapy and is due for Botox to neck 04/14/16.  Discussed continuing PT 1x/wk until Botox to establish HEP and progression of exercises and then hold after Botox for 2 weeks, with reassessment and continueing PT after return following Botox.  Discussed use of cervical soft collar for 30 minutes after therapy today and after stretching  exercises.  Discussed PSP conference-pt's husband reports they are to go to [facility] for wheelchair assessment for appropriate seating and positioning.  This therapist agrees.            PT Education - 03/26/16 1201    Education provided Yes   Education Details stretching/assistance for HEP education to husband-see instructions; trying to use soft collar neck brace for 30 minutes after stretching to maintain neck positioning   Person(s) Educated Patient;Spouse   Methods Explanation;Demonstration;Handout;Verbal cues   Comprehension Verbalized understanding;Returned demonstration;Verbal cues required             PT Long Term Goals - 03/26/16 1205    PT LONG TERM GOAL #1   Title Pt's husband will verbalize/demonstrate understanding of HEP to assist with neck and shoulder stretching and ROM.  TARGET 03/27/16  UPDATED TARGET 04/13/16 for goals   Time 4   Status On-going   PT LONG TERM GOAL #2   Title Pt will report 1-2 point decrease in neck pain by day's end, demonstrating improved tolerance to ROM and improved positioning for participation in ADLs.   Time 4   Period Weeks   Status On-going   PT LONG TERM GOAL #3   Title Pt will demonstrate improved ROM in neck extension/lateral flexion by 10% for improved participation in ADLs.   Time 4   Period Weeks   Status On-going               Plan - 03/26/16 1203    Clinical Impression Statement Pt has missed several weeks of appointments due to being out of town for [employer] MD visit and PSP conference.  Contract-relax stretching and passive stretching performed today with education to husband on several stretches.  Will attempt to use soft-collar as means of maintaining positioning after stretching exercises (not to be worn all the time).  Pt appears to tolerate upright, seated stretching well today.  LTGs remain appropriate, with extended dates, as weeks remain in POC.   Rehab Potential Fair   Clinical Impairments  Affecting Rehab Potential Progression of disease process   PT Frequency 1x / week   PT Duration 4 weeks   PT Treatment/Interventions ADLs/Self Care Home Management;Therapeutic exercise;Therapeutic activities;Neuromuscular re-education;Patient/family education;Manual techniques   PT Next Visit Plan Posture, positioning, attempts with stretching/manual techniques to improve neck positioning in supine and in sitting;review HEP with husband   Consulted and Agree with Plan of Care Patient;Family member/caregiver   Family Member Consulted Husband, Bill      Patient will benefit from skilled therapeutic intervention in order to improve the following deficits and impairments:  Decreased safety awareness, Decreased range of motion, Hypomobility, Impaired flexibility, Impaired tone, Postural dysfunction  Visit Diagnosis: Cervical dystonia  Other symptoms and signs involving the nervous system  Decreased ROM of neck     Problem List Patient Active Problem List   Diagnosis Date Noted  . Fall 10/22/2015  .  Scalp laceration 10/22/2015  . Multiple fractures of cervical spine ([place]) 10/22/2015  . Parkinson's disease ([place]) 10/22/2015  . TBI (traumatic brain injury) ([place]) 10/20/2015  . Breast cancer, right breast ([place]) 11/20/2012    MARRIOTT,AMY W. 03/26/2016, 12:10 PM  Frazier Butt., PT  [facility] [address] [place], [place], [address] Phone: [phone]   Fax:  [phone]  Name: Ahaana Diglio MRN: [id number] Date of Birth: 02-29-1946

## 2016-03-31 ENCOUNTER — Ambulatory Visit: Payer: Medicare Other | Admitting: Physical Therapy

## 2016-03-31 ENCOUNTER — Encounter: Payer: Medicare Other | Admitting: Occupational Therapy

## 2016-04-01 ENCOUNTER — Ambulatory Visit: Payer: Medicare Other | Admitting: Physical Therapy

## 2016-04-07 ENCOUNTER — Encounter: Payer: Medicare Other | Admitting: Occupational Therapy

## 2016-04-07 ENCOUNTER — Ambulatory Visit: Payer: Medicare Other | Admitting: Physical Therapy

## 2016-04-09 ENCOUNTER — Ambulatory Visit: Payer: Medicare Other | Admitting: Physical Therapy

## 2016-04-13 ENCOUNTER — Ambulatory Visit: Payer: Medicare Other | Attending: Neurology | Admitting: Physical Therapy

## 2016-04-13 DIAGNOSIS — R29818 Other symptoms and signs involving the nervous system: Secondary | ICD-10-CM | POA: Insufficient documentation

## 2016-04-13 DIAGNOSIS — R29898 Other symptoms and signs involving the musculoskeletal system: Secondary | ICD-10-CM | POA: Diagnosis present

## 2016-04-14 NOTE — Therapy (Signed)
[facility] [address] [place], [place], [address] Phone: [phone]   Fax:  [phone]  Physical Therapy Treatment  Patient Details  Name: Miranda [name] MRN: [id number] Date of Birth: August 17, 1946 Referring Provider: Pantelyat  Encounter Date: 04/13/2016      PT End of Session - 04/14/16 1231    Visit Number 3   Number of Visits 5   Date for PT Re-Evaluation 04/26/16   Authorization Type Medicare   PT Start Time 1104   PT Stop Time 1147   PT Time Calculation (min) 43 min   Activity Tolerance Patient tolerated treatment well  No c/o pain during session   Behavior During Therapy [facility] for tasks assessed/performed      Past Medical History  Diagnosis Date  . Breast cancer ([place]) 1998    lumpectomy  . Parkinson's disease ([place]) 2012  . Depression   . Hypertension   . 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 Multiple falls     Past Surgical History  Procedure Laterality Date  . Lumpectomy right Right 1998    stage IA, DCIS with micrinvasion, ER+/PR+, , right breast lower inner quadrant  . Cesarean section  1975  . Anterior release vertebral body w/ posterior fusion  2010    L1-S1  . Spinal fusion      There were no vitals filed for this visit.      Subjective Assessment - 04/14/16 1229    Subjective Husband reports not doing stretches at home, not using soft cervical collar at home much since last visit.  To have Botox at [facility] next visit.   Patient is accompained by: Family member  Husband, Bill   Pertinent History Vertebrae fractures in neck s/p fall   Patient Stated Goals Husband reports main goal is to address neck, improved posture   Currently in Pain? No/denies                   Therapeutic Activity:      [facility] Adult PT Treatment/Exercise - 04/14/16 0001    Transfers   Transfers Stand Pivot Transfers   Stand Pivot Transfers 1: +1 Total assist   Comments Pt needs  verbal cues and assistance to prevent excessive lumbar extension.     Pt able to scoot to edge of w/c, to edge of mat with verbal cues and with mod assist for lateral weightshifting.  Pt needs physical assistance for trunk flexion for safety with transfer w/c<>mat surface.  Pt requires min guard assistance once positioned edge of mat for upright sitting posture.  Therapeutic Exercise: Seated contract relax techniques, with resistance 3 seconds into neck flexion, with relax 5 seconds for therapist to assist into more neutral neck positioning (less neck flexion); repeated at least 5 times in sitting position, 5 times in reclined/supine position  -Contract-relax techniques with resistance 3 seconds into L lateral neck flexion, with relax 5 seconds for therapist to assist into more neutral neck positioning (less lateral neck flexion on L) -Passive stretching to decrease L lateral flexion, 30 seconds x 5 reps-performed in sitting position and in reclined/supine positiong -Passive stretching to decrease forward neck flexion 15 seconds x 5 reps  -In reclined sit position, therapist performs passive range of motion into cervical retraction, into neutral cervical spine posture.  Passive ROM into less L lateral flexion at least 5 reps each.  At end of stretching and ROM, pt able to rest head on 2 pillows (at initial transition into  recline supine, pt holds head off of pillows)  ROM measurements upon return to sitting following stretching: Cervical spine flexion:  26 degrees from neutral neck extension (goniometer positioned at humeral head) Cervical spine lateral flexion:  Lacks 10 degrees from neutral neck extension from L lateral flexion  Pt able to hold position for 1-2 minutes, then requires therapist assistance to maintain positioning, as she returns into increased lateral neck flexion on L and neck flexion. -No pain reported by patient during session  -With therapist assisting with upright  posture-active neck ROM against gravity, with 5 second hold -Reviewed Instruction provided to pt's husband at last visit for lateral flexion stretch and in active neck ROM and 5 second hold against gravity            PT Education - 04/14/16 1231    Education provided Yes   Education Details Discussed importance of follow-through at home with stretching and PT recommendations   Person(s) Educated Patient;Spouse   Methods Explanation;Demonstration   Comprehension Verbalized understanding             PT Long Term Goals - 04/14/16 1232    PT LONG TERM GOAL #1   Title Pt's husband will verbalize/demonstrate understanding of HEP to assist with neck and shoulder stretching and ROM.  TARGET 03/27/16  UPDATED TARGET 04/13/16 for goals   Baseline Husband reports not doing exercises at home.   Time 4   Status Not Met   PT LONG TERM GOAL #2   Title Pt will report 1-2 point decrease in neck pain by day's end, demonstrating improved tolerance to ROM and improved positioning for participation in ADLs.   Baseline Pt does not report any pain during PT session.   Time 4   Period Weeks   Status On-going   PT LONG TERM GOAL #3   Title Pt will demonstrate improved ROM in neck extension/lateral flexion by 10% for improved participation in ADLs.   Baseline Improvement in neck extension positioning-pt unable to maintain.   Time 4   Period Weeks   Status Partially Met               Plan - 04/14/16 1744    Clinical Impression Statement Pt seen today for first visit in several weeks; scheduled for botox for cervical dystonia on 04/14/16.  Pt has not met STG # 1 with pt's husband reporting not doing exercises at home.  STG #2 ongoing; STG #3 partially met, with pt improving P/ROM in neck extension positioning.  Pt has only been seen for 2 visits beyond eval, and pt will plan to return to PT after Botox shot.  At next visit, plan to update POC and recert based on assesment following Botox.    Rehab Potential Fair   Clinical Impairments Affecting Rehab Potential Progression of disease process   PT Frequency 1x / week   PT Duration 4 weeks   PT Treatment/Interventions ADLs/Self Care Home Management;Therapeutic exercise;Therapeutic activities;Neuromuscular re-education;Patient/family education;Manual techniques   PT Next Visit Plan Reassess ROM measurements and goals upon return to therapy after 2-week hold following Botox; continue to provide instructions in HEP and positioning; Will need recert (and likely g-code next visit)   Consulted and Agree with Plan of Care Patient;Family member/caregiver   Family Member Consulted Husband, Bill      Patient will benefit from skilled therapeutic intervention in order to improve the following deficits and impairments:  Decreased safety awareness, Decreased range of motion, Hypomobility, Impaired flexibility, Impaired tone, Postural  dysfunction  Visit Diagnosis: Other symptoms and signs involving the nervous system  Decreased ROM of neck     Problem List Patient Active Problem List   Diagnosis Date Noted  . Fall 10/22/2015  . Scalp laceration 10/22/2015  . Multiple fractures of cervical spine ([place]) 10/22/2015  . Parkinson's disease ([place]) 10/22/2015  . TBI (traumatic brain injury) ([place]) 10/20/2015  . Breast cancer, right breast ([place]) 11/20/2012    Birttany Dechellis W. 04/14/2016, 6:01 PM  Frazier Butt., PT  [facility] [address] [place], [place], [address] Phone: [phone]   Fax:  [phone]  Name: Raigen Jagielski MRN: [id number] Date of Birth: [birth year]

## 2016-04-15 ENCOUNTER — Encounter: Payer: Medicare Other | Admitting: Occupational Therapy

## 2016-04-15 ENCOUNTER — Ambulatory Visit: Payer: Medicare Other | Admitting: Physical Therapy

## 2016-04-20 ENCOUNTER — Encounter: Payer: Medicare Other | Admitting: Occupational Therapy

## 2016-04-20 ENCOUNTER — Ambulatory Visit: Payer: Medicare Other | Admitting: Physical Therapy

## 2016-04-22 ENCOUNTER — Ambulatory Visit: Payer: Medicare Other | Admitting: Physical Therapy

## 2016-04-22 ENCOUNTER — Encounter: Payer: Medicare Other | Admitting: Occupational Therapy

## 2016-05-03 ENCOUNTER — Encounter: Payer: Medicare Other | Admitting: Occupational Therapy

## 2016-05-05 ENCOUNTER — Ambulatory Visit: Payer: Medicare Other | Attending: Neurology | Admitting: Physical Therapy

## 2016-05-05 DIAGNOSIS — R29818 Other symptoms and signs involving the nervous system: Secondary | ICD-10-CM | POA: Insufficient documentation

## 2016-05-05 DIAGNOSIS — R29898 Other symptoms and signs involving the musculoskeletal system: Secondary | ICD-10-CM | POA: Diagnosis present

## 2016-05-05 DIAGNOSIS — R278 Other lack of coordination: Secondary | ICD-10-CM | POA: Diagnosis present

## 2016-05-05 DIAGNOSIS — M6281 Muscle weakness (generalized): Secondary | ICD-10-CM | POA: Insufficient documentation

## 2016-05-05 NOTE — Patient Instructions (Addendum)
AROM: Lateral Neck Flexion-DO THIS ACTIVITY AFTER BILL HAS HELPED STRETCH YOUR NECK SO THAT YOU ARE AS UPRIGHT AS YOU CAN GET, THEN DO THIS EXERCISE.    Slowly tilt head (ear)toward your right shoulder. Hold each position __5__ seconds. Repeat __3__ times per set.  Do _3___ sessions per day.  http://orth.exer.us/297   Copyright  VHI. All rights reserved.

## 2016-05-06 NOTE — Therapy (Signed)
[facility] [address] [place], [place], [address] Phone: [phone]   Fax:  [phone]  Physical Therapy Treatment  Patient Details  Name: Miranda [name] MRN: [id number] Date of Birth: 30-Aug-1946 Referring Provider: Pantelyat  Encounter Date: 05/05/2016      PT End of Session - 05/06/16 1418    Visit Number 4   Number of Visits 12   Date for PT Re-Evaluation 07/06/16   Authorization Type Medicare   PT Start Time 1149   PT Stop Time 1232   PT Time Calculation (min) 43 min   Activity Tolerance Patient tolerated treatment well  No c/o pain during session   Behavior During Therapy [facility] for tasks assessed/performed      Past Medical History  Diagnosis Date  . Breast cancer ([place]) 1998    lumpectomy  . Parkinson's disease ([place]) 2012  . Depression   . Hypertension   . 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 Multiple falls     Past Surgical History  Procedure Laterality Date  . Lumpectomy right Right 1998    stage IA, DCIS with micrinvasion, ER+/PR+, , right breast lower inner quadrant  . Cesarean section  1975  . Anterior release vertebral body w/ posterior fusion  2010    L1-S1  . Spinal fusion      There were no vitals filed for this visit.      Subjective Assessment - 05/05/16 1155    Subjective Saw Dr. Linus Mako and he absolutely didn't want to do the Botox in the back of the neck because he felt that it would cause her head to list more to the L.  Looking into trying to get adaptive communication device.   Patient is accompained by: Family member  Husband, Bill   Pertinent History Vertebrae fractures in neck s/p fall   Patient Stated Goals Husband reports main goal is to address neck, improved posture   Currently in Pain? No/denies                         [facility] Adult PT Treatment/Exercise - 05/06/16 0001    Exercises   Exercises Neck   Neck Exercises: Seated   Lateral  Flexion Right  3 sets of 3 reps R lateral flexion   Lateral Flexion Limitations cues to hold position for 5 seconds each rep   Manual Therapy   Passive ROM In sitting with patient in her transport wheelchair, PT performs passive ROM into neutral neck position from position of neck flexion and neck lateral flexion to L.  Pt requires multiple reps of ROM activities, slow and controlled to achieve improved positioning.       Neck ROM measurements:  Pt holds neck in 35 degrees of lateral flexion and 28 degrees of flexion upon presenting to PT session.  Following stretching and activation as above, ROM as follows:  Pt holds neck in 8 degrees of lateral flexion, 16 degrees of neck flexion.  Following activation with active R lateral flexion, pt able to achieve 5 degrees from neutral in left lateral flexion.  Neck rotation to R  32 degrees, neck rotation to L  40 degrees. (A/ROM)      Self Care:  Discussed options to assist with improving neck ROM and posture, including possibility of neck brace.  Discussed updated POC.      PT Education - 05/06/16 1417    Education provided Yes   Education Details Addition to  HEP; discussion regarding POC and updating plan and continuing PT to address neck ROM and posture.   Person(s) Educated Patient;Spouse   Methods Explanation;Demonstration;Handout;Verbal cues   Comprehension Verbalized understanding;Need further instruction             PT Long Term Goals - 2016/05/28 1424    PT LONG TERM GOAL #1   Title Pt's husband will verbalize/demonstrate understanding of updated HEP to assist with neck ROM and improved posture  TARGET 06/05/16   Status New   PT LONG TERM GOAL #2   Title Pt will report improved participation in ADLs and functional tasks with improved posture, with 1-2 point of less increase in pain by day's end.  TARGET 06/05/16   Status New   PT LONG TERM GOAL #3   Title Pt will be able to maintain improved neutral neck and posture positioning  for at least 5 minutes after exercises for improved participation in ADLs.  TARGET 06/05/16   Status New   PT LONG TERM GOAL #4   Title Pt will participate in potential fitting for appropriate neck brace to assist in improved posture for longer periods for ADL participation.  TARGET 06/05/16   Time 4   Period Weeks   Status New               Plan - 05-28-16 1420    Clinical Impression Statement Pt not seen since 04/14/16 visit due to pt awaiting Botox to neck musculature.  Pt returns to therapy today, but pt did not receive Botox per Dr. Linus Mako not feeling it was appropriate.  He feels Botox would cause decreased ability for patient to hold her neck in position.  Discused with patient and husband her goal, which continue to be improved posture for participation in ADLs.  PT will plan to continue therapy to address neck ROM and strength, postural education for improved participation in ADLs with minimal pain.  Please note updated LTGs and recert to physician this visit.   Rehab Potential Fair   Clinical Impairments Affecting Rehab Potential Progression of disease process   PT Frequency 2x / week  may increase frequency to 2x/wk   PT Duration 4 weeks   PT Treatment/Interventions ADLs/Self Care Home Management;Therapeutic exercise;Therapeutic activities;Neuromuscular re-education;Patient/family education;Manual techniques   PT Next Visit Plan Review active lateral flexion to R side; work on seated and modified sitting (with therapy ball) PWR! exercises for postural activation; look into options for neck bracing.   Consulted and Agree with Plan of Care Patient;Family member/caregiver   Family Member Consulted Husband, Bill      Patient will benefit from skilled therapeutic intervention in order to improve the following deficits and impairments:  Decreased safety awareness, Decreased range of motion, Hypomobility, Impaired flexibility, Impaired tone, Postural dysfunction  Visit  Diagnosis: Other symptoms and signs involving the nervous system  Decreased ROM of neck  Muscle weakness (generalized)       G-Codes - 05/28/16 1428    Functional Assessment Tool Used pt holds in L lateral flexion 35 degrees and 28 degrees of neck flexion; after stretching:  holds in 8 degrees of L lateral flexion and 16 degrees of neck flexion from neutral   Functional Limitation Changing and maintaining body position   Changing and Maintaining Body Position Current Status [id number]) At least 60 percent but less than 80 percent impaired, limited or restricted   Changing and Maintaining Body Position Goal Status [id number]) At least 40 percent but less than 60 percent impaired, limited  or restricted      Problem List Patient Active Problem List   Diagnosis Date Noted  . Fall 10/22/2015  . Scalp laceration 10/22/2015  . Multiple fractures of cervical spine ([place]) 10/22/2015  . Parkinson's disease ([place]) 10/22/2015  . TBI (traumatic brain injury) ([place]) 10/20/2015  . Breast cancer, right breast ([place]) 11/20/2012    Malini Flemings W. 05/06/2016, 2:35 PM  Frazier Butt., PT  [facility] [address] [place], [place], [address] Phone: [phone]   Fax:  [phone]  Name: Miranda [name] MRN: [id number] Date of Birth: 04/01/1946

## 2016-05-11 ENCOUNTER — Ambulatory Visit: Payer: Medicare Other | Admitting: Physical Therapy

## 2016-05-13 ENCOUNTER — Ambulatory Visit: Payer: Medicare Other | Admitting: Occupational Therapy

## 2016-05-13 DIAGNOSIS — R29818 Other symptoms and signs involving the nervous system: Secondary | ICD-10-CM | POA: Diagnosis not present

## 2016-05-13 DIAGNOSIS — M6281 Muscle weakness (generalized): Secondary | ICD-10-CM

## 2016-05-13 DIAGNOSIS — R29898 Other symptoms and signs involving the musculoskeletal system: Secondary | ICD-10-CM

## 2016-05-13 DIAGNOSIS — R278 Other lack of coordination: Secondary | ICD-10-CM

## 2016-05-14 NOTE — Therapy (Signed)
[facility] [address] [place], [place], [address] Phone: [phone]   Fax:  [phone]  Occupational Therapy Treatment  Patient Details  Name: Miranda [name] MRN: [id number] Date of Birth: [birth year] Referring Provider: Dr. Roosvelt Harps Pantelyat  Encounter Date: 05/13/2016      OT End of Session - 05/14/16 1620    Visit Number 7   Number of Visits 12   Date for OT Re-Evaluation 07/12/16   Authorization Type Medicare, [employer]; g-code needed   Authorization - Visit Number 1   Authorization - Number of Visits 10   OT Start Time 1406   OT Stop Time 1445   OT Time Calculation (min) 39 min   Activity Tolerance Patient tolerated treatment well   Behavior During Therapy [facility] for tasks assessed/performed      Past Medical History  Diagnosis Date  . Breast cancer ([place]) 1998    lumpectomy  . Parkinson's disease ([place]) 2012  . Depression   . Hypertension   . 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 Multiple falls     Past Surgical History  Procedure Laterality Date  . Lumpectomy right Right 1998    stage IA, DCIS with micrinvasion, ER+/PR+, , right breast lower inner quadrant  . Cesarean section  1975  . Anterior release vertebral body w/ posterior fusion  2010    L1-S1  . Spinal fusion      There were no vitals filed for this visit.      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 No/denies         Gentle P/ROM neck extension and rotation as well as  shoulder / scapular retraction stretch followed by cane exercises for shoulder flexion AA/ROM Pt practiced donning/ doffing glasses with min-mod difficulty. Pt used alphabet board very quickly and she was difficulty to understanding. Therapsit had pt practice using a capped pen with foam grip as a stylus with improved performance. Pt simulated feed task to scoop beans from bowl to container with min disffculty/ v.c. For speed.  Therapist discussed strategies for freezing while feeding with pt/ husband.  Pt's husband to bring in soft collar next visit.                       OT Short Term Goals - 03/01/16 1541    OT SHORT TERM GOAL #1   Title Pt/husband will verbalize understanding of HEP.--check STGs 03/02/16   Time 4   Period Weeks   Status Achieved   OT SHORT TERM GOAL #2   Title Pt will demo ability to drink with supervision/set-up at least 25% of the time.   Time 4   Period Weeks   Status Achieved  03/02/16 per pt/husband report   OT SHORT TERM GOAL #3   Title Pt will demo ability to but food in mouth at least 50% of the time using fingers.   Time 4   Period Weeks   Status Achieved  03/01/16--75% per pt/husband  OT Long Term Goals - 05/14/16 1618    OT LONG TERM GOAL #1   Title Pt/husband will verbalize understanding of AE/DME and strategies to incr safety, ease, and independence with ADLs. and use of alphabet board--check LTGs 07/12/16   Time 8   Period Weeks   Status On-going  continue not fully met as pt was placed on hold   OT LONG TERM GOAL #2   Title Pt will demo ability to drink with supervision/set-up at least 50% of the time.   Time 8   Period Weeks   Status On-going  not fully met as pt was placed on hold   OT LONG TERM GOAL #3   Title Pt will demo ability to but food in mouth at least 75% of the time using fingers.   Time 8   Period Weeks   Status Achieved  03/01/16 per pt/husband report   OT LONG TERM GOAL #4   Title Pt will demo  ability to put on glasses at least 75% of the time without assistance.   Time 8   Period Weeks   Status On-going  not fully met as pt was placed on hold               Plan - 05/14/16 1615    Clinical Impression Statement Pt returns to therapy after being placed on hold for anticipated botox. Pt did not receive botox injection yet she can benefit from continued skilled occupational therapy to address strategies for increased ease with ADLs.Pt demonstrates decreased coordination, abnormal posture , rigidity, decreased functional mobility, which impedes preformance of ADLS. Pt    Rehab Potential Fair   OT Frequency --  6 visits    OT Duration 8 weeks   OT Treatment/Interventions Self-care/ADL training;Therapeutic exercise;Functional Mobility Training;Patient/family education;Ultrasound;Neuromuscular education;Manual Therapy;Splinting;Therapeutic exercises;Therapeutic activities;DME and/or AE instruction;Parrafin;Cryotherapy;Electrical Stimulation;Moist Heat;Fluidtherapy;Contrast Bath;Passive range of motion;Cognitive remediation/compensation;Visual/perceptual remediation/compensation;Energy conservation   Plan Strategies for feeding and drinking, use of alphabet board   OT Home Exercise Plan Education provided:  02/03/16 initated recommendations/strategies for eating   Consulted and Agree with Plan of Care Patient;Family member/caregiver   Family Member Consulted husband      Patient will benefit from skilled therapeutic intervention in order to improve the following deficits and impairments:  Decreased safety awareness, Decreased cognition, Decreased mobility, Impaired tone, Decreased strength, Decreased activity tolerance, Impaired UE functional use, Decreased knowledge of use of DME, Decreased balance, Decreased coordination, Improper spinal/pelvic alignment  Visit Diagnosis: Other symptoms and signs involving the nervous system  Muscle weakness (generalized)  Other symptoms and  signs involving the musculoskeletal system  Other lack of coordination  Decreased ROM of neck    Problem List Patient Active Problem List   Diagnosis Date Noted  . Fall 10/22/2015  . Scalp laceration 10/22/2015  . Multiple fractures of cervical spine ([place]) 10/22/2015  . Parkinson's disease ([place]) 10/22/2015  . TBI (traumatic brain injury) ([place]) 10/20/2015  . Breast cancer, right breast ([place]) 11/20/2012    Bo Rogue 05/14/2016, 4:30 PM  [facility] [address] [place], [place], [address] Phone: [phone]   Fax:  [phone]  Name: Miranda [name] MRN: [id number] Date of Birth: October 03, 1946

## 2016-05-19 ENCOUNTER — Ambulatory Visit: Payer: Medicare Other | Admitting: Physical Therapy

## 2016-05-19 DIAGNOSIS — R29898 Other symptoms and signs involving the musculoskeletal system: Secondary | ICD-10-CM

## 2016-05-19 DIAGNOSIS — M6281 Muscle weakness (generalized): Secondary | ICD-10-CM

## 2016-05-19 DIAGNOSIS — R29818 Other symptoms and signs involving the nervous system: Secondary | ICD-10-CM | POA: Diagnosis not present

## 2016-05-19 NOTE — Therapy (Signed)
[facility] [address] [place], [place], [address] Phone: [phone]   Fax:  [phone]  Physical Therapy Treatment  Patient Details  Name: Miranda [name] MRN: [id number] Date of Birth: 1945-12-24 Referring Provider: Pantelyat  Encounter Date: 05/19/2016      PT End of Session - 05/19/16 1255    Visit Number 5   Number of Visits 12   Date for PT Re-Evaluation 07/06/16   Authorization Type Medicare   PT Start Time 1149   PT Stop Time 1233   PT Time Calculation (min) 44 min   Activity Tolerance Patient tolerated treatment well  No c/o pain during session   Behavior During Therapy [facility] for tasks assessed/performed      Past Medical History  Diagnosis Date  . Breast cancer ([place]) 1998    lumpectomy  . Parkinson's disease ([place]) 2012  . Depression   . Hypertension   . 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 Multiple falls     Past Surgical History  Procedure Laterality Date  . Lumpectomy right Right 1998    stage IA, DCIS with micrinvasion, ER+/PR+, , right breast lower inner quadrant  . Cesarean section  1975  . Anterior release vertebral body w/ posterior fusion  2010    L1-S1  . Spinal fusion      There were no vitals filed for this visit.      Subjective Assessment - 05/19/16 1254    Subjective Husband and pt states no changes since last visit.  Brought in pt's soft collar today.   Patient is accompained by: Family member  husband, bill   Pertinent History Vertebrae fractures in neck s/p fall   Limitations Other (comment)  feeding   Patient Stated Goals Husband reports main goal is to address neck, improved posture   Currently in Pain? No/denies       Seated in transport chair for neck PROM and AAROM working on upright head/neck extension and R lateral flexion using contract/relax at times. Transferred supine on mat and worked on same in the supine position.  Able to place soft cervical  collar on pt after stretching before moving into sitting on mat.   Seated PWR! Up on edge of mat with min assist for sitting balance and full upright posture x 10 reps.  Performed with pt's hands on mat and performed pec stretch at end of each rep. Seated anterior/posterior pelvic tilt x 10 with min-mod assist and verbal and tactile cues.  Discussed bracing options with pt and husband and PTA to follow up with PT on options.       PT Long Term Goals - 05/06/16 1424    PT LONG TERM GOAL #1   Title Pt's husband will verbalize/demonstrate understanding of updated HEP to assist with neck ROM and improved posture  TARGET 06/05/16   Status New   PT LONG TERM GOAL #2   Title Pt will report improved participation in ADLs and functional tasks with improved posture, with 1-2 point of less increase in pain by day's end.  TARGET 06/05/16   Status New   PT LONG TERM GOAL #3   Title Pt will be able to maintain improved neutral neck and posture positioning for at least 5 minutes after exercises for improved participation in ADLs.  TARGET 06/05/16   Status New   PT LONG TERM GOAL #4   Title Pt will participate in potential fitting for appropriate neck brace to assist in improved posture  for longer periods for ADL participation.  TARGET 06/05/16   Time 4   Period Weeks   Status New               Plan - 05/19/16 1255    Clinical Impression Statement Pt with improved neck range after stretching and able to initiate some active movement today but quickly reverts back to forward/lateral head and neck.  Continue PT per POC.   Rehab Potential Fair   Clinical Impairments Affecting Rehab Potential Progression of disease process   PT Frequency 2x / week  may increase frequency to 2x/wk   PT Duration 4 weeks   PT Treatment/Interventions ADLs/Self Care Home Management;Therapeutic exercise;Therapeutic activities;Neuromuscular re-education;Patient/family education;Manual techniques   PT Next Visit Plan Work on  seated and modified sitting (with therapy ball) PWR! exercises for postural activation; look into options for neck bracing, P/AAROM   Consulted and Agree with Plan of Care Patient;Family member/caregiver   Family Member Consulted Husband, Bill      Patient will benefit from skilled therapeutic intervention in order to improve the following deficits and impairments:  Decreased safety awareness, Decreased range of motion, Hypomobility, Impaired flexibility, Impaired tone, Postural dysfunction  Visit Diagnosis: Other symptoms and signs involving the nervous system  Muscle weakness (generalized)  Other symptoms and signs involving the musculoskeletal system  Decreased ROM of neck     Problem List Patient Active Problem List   Diagnosis Date Noted  . Fall 10/22/2015  . Scalp laceration 10/22/2015  . Multiple fractures of cervical spine ([place]) 10/22/2015  . Parkinson's disease ([place]) 10/22/2015  . TBI (traumatic brain injury) ([place]) 10/20/2015  . Breast cancer, right breast ([place]) 11/20/2012    Narda Bonds 05/19/2016, 12:58 PM  [facility] [address] [place], [place], [address] Phone: [phone]   Fax:  [phone]  Name: Taya Engelhardt MRN: [id number] Date of Birth: Mar 31, 1946    [facility] [place] 05/19/2016 12:58 PM Phone: [phone] Fax: [phone]

## 2016-05-21 ENCOUNTER — Ambulatory Visit: Payer: Medicare Other | Admitting: Occupational Therapy

## 2016-05-21 DIAGNOSIS — M6281 Muscle weakness (generalized): Secondary | ICD-10-CM

## 2016-05-21 DIAGNOSIS — R29898 Other symptoms and signs involving the musculoskeletal system: Secondary | ICD-10-CM

## 2016-05-21 DIAGNOSIS — R29818 Other symptoms and signs involving the nervous system: Secondary | ICD-10-CM

## 2016-05-21 DIAGNOSIS — R278 Other lack of coordination: Secondary | ICD-10-CM

## 2016-05-21 NOTE — Therapy (Signed)
[facility] [address] [place], [place], [address] Phone: [phone]   Fax:  [phone]  Occupational Therapy Treatment  Patient Details  Name: Miranda [name] MRN: [id number] Date of Birth: 1946/07/03 Referring Provider: Dr. Roosvelt Harps Pantelyat  Encounter Date: 05/21/2016      OT End of Session - 05/21/16 1700    Visit Number 8   Date for OT Re-Evaluation 07/12/16   Authorization Type Medicare, [employer]; g-code needed   Authorization - Visit Number 2   Authorization - Number of Visits 10   OT Start Time 0205   OT Stop Time 0245   OT Time Calculation (min) 40 min      Past Medical History  Diagnosis Date  . Breast cancer ([place]) 1998    lumpectomy  . Parkinson's disease ([place]) 2012  . Depression   . Hypertension   . 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 Multiple falls     Past Surgical History  Procedure Laterality Date  . Lumpectomy right Right 1998    stage IA, DCIS with micrinvasion, ER+/PR+, , right breast lower inner quadrant  . Cesarean section  1975  . Anterior release vertebral body w/ posterior fusion  2010    L1-S1  . Spinal fusion      There were no vitals filed for this visit.      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 No/denies           Treatment: Gentle passive stretch to neck and shoulders in extension for upright posture in prep for feeding. (Prior to stretching, Therapist noted reddened area on dorsal neck which may be a birth  mark. While wearing soft neck collar, after set up, pt was able to feed herself a container of applesauce  with mod v.c. To pause in between bites.  Pt's husband reports that ST recommends nectar thick liquids for safety. Therapist thickened a coke for pt and she attempted use of nosey cup yet was unsuccessful. Pt was unable to drink through a straw, pt was able to drink the thickened coke using a spoon successfully.(Consistency became thicker than nectar as drink sat)                     OT Short Term Goals - 03/01/16 1541    OT SHORT TERM GOAL #1   Title Pt/husband will verbalize understanding of HEP.--check STGs 03/02/16   Time 4   Period Weeks   Status Achieved   OT SHORT TERM GOAL #2   Title Pt will demo ability to drink with supervision/set-up at least 25% of the time.   Time 4   Period Weeks   Status Achieved  03/02/16 per pt/husband report   OT SHORT TERM GOAL #3   Title Pt will demo ability to but food in mouth at least 50% of the time using fingers.   Time 4   Period Weeks   Status Achieved  03/01/16--75% per pt/husband           OT  Long Term Goals - 05/14/16 1618    OT LONG TERM GOAL #1   Title Pt/husband will verbalize understanding of AE/DME and strategies to incr safety, ease, and independence with ADLs. and use of alphabet board--check LTGs 07/12/16   Time 8   Period Weeks   Status On-going  continue not fully met as pt was placed on hold   OT LONG TERM GOAL #2   Title Pt will demo ability to drink with supervision/set-up at least 50% of the time.   Time 8   Period Weeks   Status On-going  not fully met as pt was placed on hold   OT LONG TERM GOAL #3   Title Pt will demo ability to but food in mouth at least 75% of the time using fingers.   Time 8   Period Weeks   Status Achieved  03/01/16 per pt/husband report   OT LONG TERM GOAL #4   Title Pt will demo ability to put on glasses at least 75% of the time without assistance.   Time 8   Period  Weeks   Status On-going  not fully met as pt was placed on hold               Plan - 05/21/16 1657    Clinical Impression Statement Pt is progressing towards goals. She demonstrates improved posture and neck extension today for eating after stretching the last 2 days( by PT and trainer).   Rehab Potential Fair   OT Frequency --  6 visits   OT Duration 8 weeks   OT Treatment/Interventions Self-care/ADL training;Therapeutic exercise;Functional Mobility Training;Patient/family education;Ultrasound;Neuromuscular education;Manual Therapy;Splinting;Therapeutic exercises;Therapeutic activities;DME and/or AE instruction;Parrafin;Cryotherapy;Electrical Stimulation;Moist Heat;Fluidtherapy;Contrast Bath;Passive range of motion;Cognitive remediation/compensation;Visual/perceptual remediation/compensation;Energy conservation   Plan use of alphabet board, stratgies for freezing when eating with hands   OT Home Exercise Plan Education provided:  02/03/16 initated recommendations/strategies for eating   Consulted and Agree with Plan of Care Patient;Family member/caregiver   Family Member Consulted husband      Patient will benefit from skilled therapeutic intervention in order to improve the following deficits and impairments:  Decreased safety awareness, Decreased cognition, Decreased mobility, Impaired tone, Decreased strength, Decreased activity tolerance, Impaired UE functional use, Decreased knowledge of use of DME, Decreased balance, Decreased coordination, Improper spinal/pelvic alignment  Visit Diagnosis: Other symptoms and signs involving the nervous system  Muscle weakness (generalized)  Other symptoms and signs involving the musculoskeletal system  Other lack of coordination    Problem List Patient Active Problem List   Diagnosis Date Noted  . Fall 10/22/2015  . Scalp laceration 10/22/2015  . Multiple fractures of cervical spine ([place]) 10/22/2015  . Parkinson's disease ([place])  10/22/2015  . TBI (traumatic brain injury) ([place]) 10/20/2015  . Breast cancer, right breast ([place]) 11/20/2012    Keva Darty 05/21/2016, 5:00 PM Theone Murdoch, OTR/L [phone] Phone: [phone] 5:01 PM 05/21/2016 [facility] [address] [place], [place], [address] Phone: [phone]   Fax:  [phone]  Name: Miranda [name] MRN: [id number] Date of Birth: 06-24-1946

## 2016-05-24 ENCOUNTER — Ambulatory Visit: Payer: Medicare Other | Admitting: Occupational Therapy

## 2016-05-24 DIAGNOSIS — R29898 Other symptoms and signs involving the musculoskeletal system: Secondary | ICD-10-CM

## 2016-05-24 DIAGNOSIS — R29818 Other symptoms and signs involving the nervous system: Secondary | ICD-10-CM

## 2016-05-24 DIAGNOSIS — R278 Other lack of coordination: Secondary | ICD-10-CM

## 2016-05-24 DIAGNOSIS — M6281 Muscle weakness (generalized): Secondary | ICD-10-CM

## 2016-05-24 NOTE — Therapy (Signed)
[facility] [address] [place], [place], [address] Phone: [phone]   Fax:  [phone]  Occupational Therapy Treatment  Patient Details  Name: Miranda [name] MRN: [id number] Date of Birth: [birth year] Referring Provider: Dr. Roosvelt Harps Pantelyat  Encounter Date: 05/24/2016      OT End of Session - 05/24/16 1412    Visit Number 9   Number of Visits 12   Date for OT Re-Evaluation 07/12/16   Authorization Type Medicare, [employer]; g-code needed   Authorization - Visit Number 3   Authorization - Number of Visits 10   OT Start Time 3267   OT Stop Time 1447   OT Time Calculation (min) 42 min      Past Medical History:  Diagnosis Date  . Breast cancer ([place]) 1998   lumpectomy  . Depression   . Hypertension   . Multiple falls   . Parkinson's disease ([place]) 2012  . Progressive supranuclear palsy [facility])     Past Surgical History:  Procedure Laterality Date  . ANTERIOR RELEASE VERTEBRAL BODY W/ POSTERIOR FUSION  2010   L1-S1  . CESAREAN SECTION  1975  . lumpectomy right Right 1998   stage IA, DCIS with micrinvasion, ER+/PR+, , right breast lower inner quadrant  . SPINAL FUSION      There were no vitals filed for this visit.      Subjective Assessment - 05/24/16 1923    Subjective  Pt reports that it is hard to chew with soft collar on.  Husband reports that it was not recommended that pt drink thickened liquids all the time.   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 No/denies         Gentle passive stretch and AAROM to neck  in extension and to the left for upright posture and approximate midline as able in  prep for feeding.  Also facilitated AAROM scapular retraction.  Then AAROM to neck with rotation to each side and extension with shoulder flex and lateral trunk stretch as able to facilitate postural alignment in prep for feeding.  Pt brought finger foods (cut up sandwich) and was able to feed herself with min-mod cues for timing and occasional min cues to re-start for freezing strategy after stretching and while wearing soft neck brace.  Reviewed that pt will continue to need cueing for timing/freezing due to nature of PSP.  Also reviewed strategy of using utensil prn if pt freezes less with utensil (however, may need to cut sandwich into smaller bite-sized pieces).  Pt/husband verbalized understanding.  Pt drank with nosey cup with min-mod A without coughing (husband insisted that pt was not restricted from drinking water and pt/husband requested regular water).  Pt did have one freezing episode with drinking where she could not pull water into mouth.  Husband reports that MBSS showed difficulty with pulling liquid from front of mouth to back of mouth but if she got it to the back of the mouth, she was able to swallow fine.  Strongly recommended that husband/pt stretch prior to eating and that pt wear neck brace when eating for improved postural alignment with eating and to decr risk of aspiration.  However, pt reports that she  feels like she has difficulty chewing with brace (but no coughing or difficulty with chewing observed).  Recommended that pt/husband clarify with speech therapist (at [facility]) swallowing precautions, whether it is ok for pt to drink thin liquids, and whether speech therapist recommends soft collar with eating/drinking when she goes to her next visit.--Will defer further recommendations regarding this to speech therapists.  Pt donned glasses x2 with 1 freezing episode, but was able to complete task with cueing to "restart."  Husband reports that pt has had w/c eval at [facility] and  that she may try speech device that will "talk for her."                               OT Short Term Goals - 03/01/16 1541      OT SHORT TERM GOAL #1   Title Pt/husband will verbalize understanding of HEP.--check STGs 03/02/16   Time 4   Period Weeks   Status Achieved     OT SHORT TERM GOAL #2   Title Pt will demo ability to drink with supervision/set-up at least 25% of the time.   Time 4   Period Weeks   Status Achieved  03/02/16 per pt/husband report     OT SHORT TERM GOAL #3   Title Pt will demo ability to but food in mouth at least 50% of the time using fingers.   Time 4   Period Weeks   Status Achieved  03/01/16--75% per pt/husband           OT Long Term Goals - 05/14/16 1618      OT LONG TERM GOAL #1   Title Pt/husband will verbalize understanding of AE/DME and strategies to incr safety, ease, and independence with ADLs. and use of alphabet board--check LTGs 07/12/16   Time 8   Period Weeks   Status On-going  continue not fully met as pt was placed on hold     OT LONG TERM GOAL #2   Title Pt will demo ability to drink with supervision/set-up at least 50% of the time.   Time 8   Period Weeks   Status On-going  not fully met as pt was placed on hold     OT LONG TERM GOAL #3   Title Pt will demo ability to but food in mouth at least 75% of the time using fingers.   Time 8   Period Weeks   Status Achieved  03/01/16 per pt/husband report     OT LONG TERM GOAL #4   Title Pt will demo ability to put on glasses at least 75% of the time without assistance.   Time 8   Period Weeks   Status On-going  not fully met as pt was placed on hold               Plan - 05/24/16 1925    Clinical Impression Statement Pt/husband report decr carryover with recommendations as pt does not like wearing soft collar or drinking thickened liquids and although they are stretching at home now, they are not stretching prior to eating.  Husband insists that it  was not recommended that pt drink thickened liquids all the time (after MBSS) and that pt drinks more thin liquids than thickened as she does not like it.  Reviewed recommendation for neck stretching prior to eating.  Pt with no coughing during eating/drinking today, but with episodes of freezing (improved with cueing)  Rehab Potential Fair   OT Frequency --  6 visits   OT Duration 8 weeks   OT Treatment/Interventions Self-care/ADL training;Therapeutic exercise;Functional Mobility Training;Patient/family education;Ultrasound;Neuromuscular education;Manual Therapy;Splinting;Therapeutic exercises;Therapeutic activities;DME and/or AE instruction;Parrafin;Cryotherapy;Electrical Stimulation;Moist Heat;Fluidtherapy;Contrast Bath;Passive range of motion;Cognitive remediation/compensation;Visual/perceptual remediation/compensation;Energy conservation   Plan use of alphabet board, donning/doffing glassess, anticipate d/c next session   OT Home Exercise Plan Education provided:  02/03/16 initated recommendations/strategies for eating   Consulted and Agree with Plan of Care Patient;Family member/caregiver   Family Member Consulted husband      Patient will benefit from skilled therapeutic intervention in order to improve the following deficits and impairments:  Decreased safety awareness, Decreased cognition, Decreased mobility, Impaired tone, Decreased strength, Decreased activity tolerance, Impaired UE functional use, Decreased knowledge of use of DME, Decreased balance, Decreased coordination, Improper spinal/pelvic alignment  Visit Diagnosis: Other symptoms and signs involving the nervous system  Muscle weakness (generalized)  Other symptoms and signs involving the musculoskeletal system  Other lack of coordination    Problem List Patient Active Problem List   Diagnosis Date Noted  . Fall 10/22/2015  . Scalp laceration 10/22/2015  . Multiple fractures of cervical spine ([place]) 10/22/2015  .  Parkinson's disease ([place]) 10/22/2015  . TBI (traumatic brain injury) ([place]) 10/20/2015  . Breast cancer, right breast ([place]) 11/20/2012    [facility] 05/24/2016, 8:15 PM  [facility] [address] [place], [place], [address] Phone: [phone]   Fax:  [phone]  Name: Miranda [name] MRN: [id number] Date of Birth: 10/17/1946   Vianne Bulls, OTR/L [facility] [address] [place], [place]  [address] [phone] phone [phone] 05/24/16 8:15 PM

## 2016-05-27 ENCOUNTER — Ambulatory Visit: Payer: Medicare Other | Admitting: Physical Therapy

## 2016-05-27 DIAGNOSIS — R29818 Other symptoms and signs involving the nervous system: Secondary | ICD-10-CM | POA: Diagnosis not present

## 2016-05-27 DIAGNOSIS — M6281 Muscle weakness (generalized): Secondary | ICD-10-CM

## 2016-05-28 NOTE — Therapy (Signed)
[facility] [address] [place], [place], [address] Phone: [phone]   Fax:  [phone]  Physical Therapy Treatment  Patient Details  Name: Miranda [name] MRN: [id number] Date of Birth: [birth year] Referring Provider: Pantelyat  Encounter Date: 05/27/2016      PT End of Session - 05/28/16 0748    Visit Number 6   Number of Visits 12   Date for PT Re-Evaluation 07/06/16   Authorization Type Medicare   PT Start Time 1319   PT Stop Time 1402   PT Time Calculation (min) 43 min   Activity Tolerance Patient tolerated treatment well  rates pain as 2/10 at end of session   Behavior During Therapy [facility] for tasks assessed/performed      Past Medical History:  Diagnosis Date  . Breast cancer ([place]) 1998   lumpectomy  . Depression   . Hypertension   . Multiple falls   . Parkinson's disease ([place]) 2012  . Progressive supranuclear palsy [facility])     Past Surgical History:  Procedure Laterality Date  . ANTERIOR RELEASE VERTEBRAL BODY W/ POSTERIOR FUSION  2010   L1-S1  . CESAREAN SECTION  1975  . lumpectomy right Right 1998   stage IA, DCIS with micrinvasion, ER+/PR+, , right breast lower inner quadrant  . SPINAL FUSION      There were no vitals filed for this visit.      Subjective Assessment - 05/27/16 1324    Subjective Husband reports headache today since about 10:00; has been improving since this morning.   Patient is accompained by: Family member  Bill   Pertinent History Vertebrae fractures in neck s/p fall   Limitations --  feeding   Patient Stated Goals Husband reports main goal is to address neck, improved posture   Currently in Pain? Yes   Pain Score 5    Pain Location Head  posterior   Pain Orientation Right   Pain Type Acute pain   Pain Onset Today   Pain Frequency Constant      Manual therapy:   Pt seated in transport chair for neck PROM and AAROM working on upright head/neck extension  and R lateral flexion using contract/relax at times.  Cues for patient to activate neck musculature out of L lateral flexion and towards neutral posture, alternating between cues for activation R lateral flexion and neck extension. Transferred supine on mat and worked on same in the supine position.  Pt requires 2 pillows, and has difficulty relaxing into supine position on pillows.   Neuro Re-education" Seated PWR! Up on edge of mat, with UEs on blue therapy ball, with min assist for sitting balance and full upright posture x 10 reps.  Performed seated PWR! Rock forward and back with UEs on blue therapy ball, with min assist for balance and upright posture.  Self care:   Discussed pt's upcoming appointment at [facility], with neurologist.  Recommended pt's husband discuss bracing options with neurologist, given pt's multiple co-morbidities (including previous neck fractures).  Suggested pt/husband request order for orthotist for appropriate bracing options.  Asked about HEP-pt reports performing exercises with pt. Husband asks about continueing PT.  Plan to assess ROM and goals next week, with further discussion next visit.                              PT Long Term Goals - 05/06/16 1424      PT LONG TERM  GOAL #1   Title Pt's husband will verbalize/demonstrate understanding of updated HEP to assist with neck ROM and improved posture  TARGET 06/05/16   Status New     PT LONG TERM GOAL #2   Title Pt will report improved participation in ADLs and functional tasks with improved posture, with 1-2 point of less increase in pain by day's end.  TARGET 06/05/16   Status New     PT LONG TERM GOAL #3   Title Pt will be able to maintain improved neutral neck and posture positioning for at least 5 minutes after exercises for improved participation in ADLs.  TARGET 06/05/16   Status New     PT LONG TERM GOAL #4   Title Pt will participate in potential fitting for appropriate neck brace  to assist in improved posture for longer periods for ADL participation.  TARGET 06/05/16   Time 4   Period Weeks   Status New               Plan - 05/28/16 0749    Clinical Impression Statement Pt continues to demonstrate improved activation out of L lateral flexion towards neutral posture and initial activation bringing chin away from chest.  Pt continues have difficulty with sustaining neck positioning.  Pt comes to therapy with head/neck pain today, which is lessened following stretching activities.  Pt will continue to benefit from further skilled PT to address posture, flexibility and strength.   Rehab Potential Fair   Clinical Impairments Affecting Rehab Potential Progression of disease process   PT Frequency 2x / week  may increase frequency to 2x/wk   PT Duration 4 weeks   PT Treatment/Interventions ADLs/Self Care Home Management;Therapeutic exercise;Therapeutic activities;Neuromuscular re-education;Patient/family education;Manual techniques   PT Next Visit Plan Check ROM measurements; discuss POC-d/c versus renew.   Consulted and Agree with Plan of Care Patient;Family member/caregiver   Family Member Consulted Husband, Bill      Patient will benefit from skilled therapeutic intervention in order to improve the following deficits and impairments:  Decreased safety awareness, Decreased range of motion, Hypomobility, Impaired flexibility, Impaired tone, Postural dysfunction  Visit Diagnosis: Other symptoms and signs involving the nervous system  Muscle weakness (generalized)     Problem List Patient Active Problem List   Diagnosis Date Noted  . Fall 10/22/2015  . Scalp laceration 10/22/2015  . Multiple fractures of cervical spine ([place]) 10/22/2015  . Parkinson's disease ([place]) 10/22/2015  . TBI (traumatic brain injury) ([place]) 10/20/2015  . Breast cancer, right breast ([place]) 11/20/2012    Piera Downs W. 05/28/2016, 7:55 AM  Frazier Butt., PT  [facility] [address] [place], [place], [address] Phone: [phone]   Fax:  [phone]  Name: Deriah Torrez MRN: [id number] Date of Birth: May 28, 1946

## 2016-05-31 ENCOUNTER — Ambulatory Visit: Payer: Medicare Other | Admitting: Occupational Therapy

## 2016-06-02 ENCOUNTER — Ambulatory Visit: Payer: Medicare Other | Attending: Neurology | Admitting: Physical Therapy

## 2016-06-02 ENCOUNTER — Ambulatory Visit: Payer: Medicare Other | Admitting: Occupational Therapy

## 2016-06-02 DIAGNOSIS — M6281 Muscle weakness (generalized): Secondary | ICD-10-CM | POA: Insufficient documentation

## 2016-06-02 DIAGNOSIS — R29818 Other symptoms and signs involving the nervous system: Secondary | ICD-10-CM | POA: Diagnosis present

## 2016-06-02 DIAGNOSIS — R29898 Other symptoms and signs involving the musculoskeletal system: Secondary | ICD-10-CM

## 2016-06-02 DIAGNOSIS — R278 Other lack of coordination: Secondary | ICD-10-CM | POA: Insufficient documentation

## 2016-06-02 NOTE — Patient Instructions (Signed)
PWR! Hands   Then, start with elbows bent and hands closed.  PWR! Hands: Push hands out BIG. Elbows straight, wrists up, fingers open and spread apart BIG. (Can also perform by pushing down on table, chair, knees. Push above head, out to the side, behind you, in front of you.)   ** Make each movement big and deliberate so that you feel the movement.  Perform at least 10 repetitions 1x/day, but perform PWR! hands throughout the day when you are having trouble using your hands (picking up/manipulating small objects, writing, eating, typing, sewing, buttoning, etc.).

## 2016-06-02 NOTE — Therapy (Signed)
[facility] [address] [place], [place], [address] Phone: [phone]   Fax:  [phone]  Physical Therapy Treatment  Patient Details  Name: Miranda [name] MRN: [id number] Date of Birth: 1946/08/01 Referring Provider: Pantelyat  Encounter Date: 06/02/2016      PT End of Session - 06/02/16 1226    Visit Number 7   Number of Visits 12   Date for PT Re-Evaluation 07/06/16   Authorization Type Medicare   PT Start Time 1023   PT Stop Time 1102   PT Time Calculation (min) 39 min   Activity Tolerance Patient tolerated treatment well  rates pain as 2/10 at end of session   Behavior During Therapy [facility] for tasks assessed/performed      Past Medical History:  Diagnosis Date  . Breast cancer ([place]) 1998   lumpectomy  . Depression   . Hypertension   . Multiple falls   . Parkinson's disease ([place]) 2012  . Progressive supranuclear palsy [facility])     Past Surgical History:  Procedure Laterality Date  . ANTERIOR RELEASE VERTEBRAL BODY W/ POSTERIOR FUSION  2010   L1-S1  . CESAREAN SECTION  1975  . lumpectomy right Right 1998   stage IA, DCIS with micrinvasion, ER+/PR+, , right breast lower inner quadrant  . SPINAL FUSION      There were no vitals filed for this visit.      Subjective Assessment - 06/02/16 1224    Subjective No complaints of pain this morning.   Patient is accompained by: Family member  Miranda [name]   Pertinent History Vertebrae fractures in neck s/p fall   Limitations --  feeding   Patient Stated Goals Husband reports main goal is to address neck, improved posture   Currently in Pain? No/denies   Pain Onset Today      Therapeutic Exercise:   Pt seated in wheelchair for neck PROM and AAROM working on upright head/neck extension and R lateral flexion using contract/relax at times.  Cues for patient to activate neck musculature out of L lateral flexion and towards neutral posture, alternating between  cues for activation R lateral flexion and neck extension and neck rotation towards neutral.  ROM measurements following stretching and A/AROM:  Pt able to achieve 4 degrees from neutral lateral flexion to L; pt able to achieve neutral cervical spine rotation (from 10 degrees rotated to R).  Pt holds neck in 24 degrees of neck flexion beyond neutral-unable to improve from this position into further extension. Pt unable to sustain holding position >1-2 minutes. Recommended for optimal functional posture/positioning improvements, that pt/husband rotate between lateral neck flexion towards R, neck rotation to L, and moving into neck extension.  Husband verbalizes understanding.  Pt is able to return demonstrate understanding of previously issued HEP.   Neuro Re-education:  Seated PWR! Up on edge of mat, with UEs on blue therapy ball, with min assist for sitting balance and full upright posture x 10 reps.  (Added to HEP, to be performed at chair or coffee table for UE support and push off).  Performed seated PWR! Rock forward and back with UEs on blue therapy ball, with min assist for balance and upright posture.  Self care:   -Discussed POC, progress towards goals, plans for discharge this visit.  Pt/husband in agreement.   -Discussed possibility of return to therapy once pt has received her custom wheelchair and is able to utilize functions for improved positioning.   -Discussed again need to speak  to Dr. Tonye Royalty regarding options for neck brace for positioning.                         PT Education - 06-25-16 1225    Education provided Yes   Education Details Addition to HEP, discussion of rotation of exercises for optimal posture/positioning; plans for discharge this visit.   Person(s) Educated Patient;Spouse   Methods Explanation;Demonstration;Handout;Verbal cues   Comprehension Verbalized understanding;Returned demonstration             PT Long Term Goals - June 25, 2016  1227      PT LONG TERM GOAL #1   Title Pt's husband will verbalize/demonstrate understanding of updated HEP to assist with neck ROM and improved posture  TARGET 06/05/16   Status Achieved     PT LONG TERM GOAL #2   Title Pt will report improved participation in ADLs and functional tasks with improved posture, with 1-2 point of less increase in pain by day's end.  TARGET 06/05/16   Status Partially Met     PT LONG TERM GOAL #3   Title Pt will be able to maintain improved neutral neck and posture positioning for at least 5 minutes after exercises for improved participation in ADLs.  TARGET 06/05/16   Status Not Met     PT LONG TERM GOAL #4   Title Pt will participate in potential fitting for appropriate neck brace to assist in improved posture for longer periods for ADL participation.  TARGET 06/05/16   Baseline Deferred to patient/husband to discuss with Dr. Tonye Royalty next visit for appropriate referral for brace.   Time 4   Period Weeks   Status Deferred               Plan - 25-Jun-2016 1228    Clinical Impression Statement Pt has met LTG #1; LTG #2 partially met, as pain reports fluctuate; LTG #3 partially met; LTG #4 deferred, to speak with Dr. Tonye Royalty regarding specific neck brace.  Pt appears to have improved activation of neck movement towards neutral from L lateral flexed position, improved activation with trunk rotation, but continues to have difficulty with initiating movement into neck extension.  She has difficulty maintaining positioning greater than 1-2 minutes.  Pt's husband is aware of how to assist with stretching and exercises for posture/positioning.  Pt is appropriate for discharge at this time.   Rehab Potential Fair   Clinical Impairments Affecting Rehab Potential Progression of disease process   PT Frequency 2x / week  may increase frequency to 2x/wk   PT Duration 4 weeks   PT Treatment/Interventions ADLs/Self Care Home Management;Therapeutic exercise;Therapeutic  activities;Neuromuscular re-education;Patient/family education;Manual techniques   PT Next Visit Plan Plan for discharge this visit.  Possible return to PT after custom wheelchair delivered/fitted, with improved positioning.   Consulted and Agree with Plan of Care Patient;Family member/caregiver   Family Member Consulted Husband, Miranda [name]      Patient will benefit from skilled therapeutic intervention in order to improve the following deficits and impairments:  Decreased safety awareness, Decreased range of motion, Hypomobility, Impaired flexibility, Impaired tone, Postural dysfunction  Visit Diagnosis: Other symptoms and signs involving the nervous system  Muscle weakness (generalized)       G-Codes - 06/25/2016 1232    Functional Assessment Tool Used Neck positioning after stretching/active movement:  -4 degrees from neutral (lateral flexion), 24 degrees of neck flexion from neutral.   Functional Limitation Changing and maintaining body position   Changing  and Maintaining Body Position Goal Status [phone]) At least 40 percent but less than 60 percent impaired, limited or restricted   Changing and Maintaining Body Position Discharge Status [phone]) At least 60 percent but less than 80 percent impaired, limited or restricted      Problem List Patient Active Problem List   Diagnosis Date Noted  . Fall 10/22/2015  . Scalp laceration 10/22/2015  . Multiple fractures of cervical spine ([place]) 10/22/2015  . Parkinson's disease ([place]) 10/22/2015  . TBI (traumatic brain injury) ([place]) 10/20/2015  . Breast cancer, right breast ([place]) 11/20/2012    Jasmon Graffam W. 06/02/2016, 12:37 PM  Frazier Butt., PT  [facility] [address] [place], [place], [address] Phone: [phone]   Fax:  [phone]  Name: Miranda [name] MRN: [id number] Date of Birth: 1945/12/21  PHYSICAL THERAPY DISCHARGE SUMMARY  Visits from Start of Care:  7  Current functional level related to goals / functional outcomes:     PT Long Term Goals - 06/02/16 1227      PT LONG TERM GOAL #1   Title Pt's husband will verbalize/demonstrate understanding of updated HEP to assist with neck ROM and improved posture  TARGET 06/05/16   Status Achieved     PT LONG TERM GOAL #2   Title Pt will report improved participation in ADLs and functional tasks with improved posture, with 1-2 point of less increase in pain by day's end.  TARGET 06/05/16   Status Partially Met     PT LONG TERM GOAL #3   Title Pt will be able to maintain improved neutral neck and posture positioning for at least 5 minutes after exercises for improved participation in ADLs.  TARGET 06/05/16   Status Not Met     PT LONG TERM GOAL #4   Title Pt will participate in potential fitting for appropriate neck brace to assist in improved posture for longer periods for ADL participation.  TARGET 06/05/16   Baseline Deferred to patient/husband to discuss with Dr. Tonye Royalty next visit for appropriate referral for brace.   Time 4   Period Weeks   Status Deferred    Pt noted to have improved activation of neck musculature, but pt unable to maintain positioning >1-2 minutes.   Remaining deficits: Posture, weakness, pain   Education / Equipment: Educated in [employer], positioning techniques.  Plan: Patient agrees to discharge.  Patient goals were partially met. Patient is being discharged due to                                                     ?????maximizing rehab potential at this time.  Pt may return to therapy after receiving custom wheelchair with improved positioning.    Mady Haagensen, PT 06/02/16 12:46 PM Phone: [phone] Fax: [phone]

## 2016-06-02 NOTE — Therapy (Signed)
[facility] [address] [place], [place], [address] Phone: [phone]   Fax:  [phone]  Occupational Therapy Treatment  Patient Details  Name: Miranda [name] MRN: [id number] Date of Birth: 1946/05/11 Referring Provider: Dr. Roosvelt Harps Pantelyat  Encounter Date: 06/02/2016      OT End of Session - 06/02/16 1144    Visit Number 10   Number of Visits 12   Authorization - Visit Number 4   Authorization - Number of Visits 10   OT Start Time 1104  2 units only due to pt request/ fatigue following PT   OT Stop Time 1134   OT Time Calculation (min) 30 min   Activity Tolerance Patient limited by fatigue   Behavior During Therapy [facility] for tasks assessed/performed      Past Medical History:  Diagnosis Date  . Breast cancer ([place]) 1998   lumpectomy  . Depression   . Hypertension   . Multiple falls   . Parkinson's disease ([place]) 2012  . Progressive supranuclear palsy [facility])     Past Surgical History:  Procedure Laterality Date  . ANTERIOR RELEASE VERTEBRAL BODY W/ POSTERIOR FUSION  2010   L1-S1  . CESAREAN SECTION  1975  . lumpectomy right Right 1998   stage IA, DCIS with micrinvasion, ER+/PR+, , right breast lower inner quadrant  . SPINAL FUSION      There were no vitals filed for this visit.      Subjective Assessment - 06/02/16 1137    Subjective  Pt's husband reports that per ST at [facility] pt does not have to use thickener and she should not use soft collar if it makes it harder for her to chew.   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 Pt practiced donning/ doffing glasses with min v.c for 1 trial, pt donned/ doffed without cueing 3x Pt drank water from a cup with a straw following set up, Pt/ husband agree with plans for d/c                      OT Education - 06/02/16 1653    Education provided Yes   Education Details PWR!hands, stretching before meals for improved posture, cueing to reset if freezing episodes   Person(s) Educated Patient;Spouse   Methods Explanation;Demonstration;Handout   Comprehension Verbalized understanding;Returned demonstration          OT Short Term Goals - 03/01/16 1541      OT SHORT TERM GOAL #1   Title Pt/husband will verbalize understanding of HEP.--check STGs 03/02/16   Time 4   Period Weeks   Status Achieved     OT SHORT TERM GOAL #2   Title Pt will demo ability to drink with supervision/set-up at least 25% of the time.   Time 4   Period Weeks   Status Achieved  03/02/16 per pt/husband report  OT SHORT TERM GOAL #3   Title Pt will demo ability to but food in mouth at least 50% of the time using fingers.   Time 4   Period Weeks   Status Achieved  03/01/16--75% per pt/husband           OT Long Term Goals - 06-08-2016 1115      OT LONG TERM GOAL #1   Title Pt/husband will verbalize understanding of AE/DME and strategies to incr safety, ease, and independence with ADLs. and use of alphabet board--check LTGs 07/12/16   Time 8   Period Weeks   Status Achieved     OT LONG TERM GOAL #2   Title Pt will demo ability to drink with supervision/set-up at least 50% of the time.   Period Weeks   Status Achieved  met with straw per husband report     OT LONG TERM GOAL #3   Title Pt will demo ability to but food in mouth at least 75% of the time using fingers.   Time 8   Period Weeks    Status Achieved     OT LONG TERM GOAL #4   Title Pt will demo ability to put on glasses at least 75% of the time without assistance.   Time 8   Period Weeks   Status Achieved  per husband report               Plan - June 08, 2016 1140    Clinical Impression Statement Pt's husband reports that per ST at [facility] pt does not have to use thickener in liquids and she should not wear soft collar if it is causing difficulties with chewing. Pt is schedule for a new w/c and hopefully ift will improve pt positioning for feeding as it will tilt in space. Pt/ husband agree with palns for d/c today.   Rehab Potential Fair   OT Frequency --  6 visits   OT Duration 8 weeks   OT Treatment/Interventions Self-care/ADL training;Therapeutic exercise;Functional Mobility Training;Patient/family education;Ultrasound;Neuromuscular education;Manual Therapy;Splinting;Therapeutic exercises;Therapeutic activities;DME and/or AE instruction;Parrafin;Cryotherapy;Electrical Stimulation;Moist Heat;Fluidtherapy;Contrast Bath;Passive range of motion;Cognitive remediation/compensation;Visual/perceptual remediation/compensation;Energy conservation   Plan d/c OT   Consulted and Agree with Plan of Care Patient;Family member/caregiver   Family Member Consulted husband      Patient will benefit from skilled therapeutic intervention in order to improve the following deficits and impairments:  Decreased safety awareness, Decreased cognition, Decreased mobility, Impaired tone, Decreased strength, Decreased activity tolerance, Impaired UE functional use, Decreased knowledge of use of DME, Decreased balance, Decreased coordination, Improper spinal/pelvic alignment  Visit Diagnosis: Other symptoms and signs involving the nervous system  Muscle weakness (generalized)  Other symptoms and signs involving the musculoskeletal system  Other lack of coordination      G-Codes - 06-08-16 1645    Functional Assessment Tool Used  clinical impressions for feeding, donning glasses   Functional Limitation Self care   Self Care Goal Status ([id number]) At least 40 percent but less than 60 percent impaired, limited or restricted   Self Care Discharge Status [phone]) At least 40 percent but less than 60 percent impaired, limited or restricted      Problem List Patient Active Problem List   Diagnosis Date Noted  . Fall 10/22/2015  . Scalp laceration 10/22/2015  . Multiple fractures of cervical spine ([place]) 10/22/2015  . Parkinson's disease ([place]) 10/22/2015  . TBI (traumatic brain injury) ([place]) 10/20/2015  . Breast cancer, right breast ([place]) 11/20/2012  OCCUPATIONAL THERAPY DISCHARGE SUMMARY  Current functional  level related to goals / functional outcomes: See above   Remaining deficits: Abnormal posture, rigidity, decreased coordination, cognitive deficits, decreased A/ROM, decreased strength, decreased functional mobility, pain, freezing   Education / Equipment: Pt/ husband were educated regarding adapted strategies for ADLs. Pt's husband verbalized understanding.  Plan: Patient agrees to discharge.  Patient goals were met. Patient is being discharged due to meeting the stated rehab goals.  ?????      Orlan Aversa 06/02/2016, 4:55 PM Theone Murdoch, OTR/L [phone] Phone: [phone] 4:55 PM 06/02/16 [facility] [address] [place], [place], [address] Phone: [phone]   Fax:  [phone]  Name: Chalonda Schlatter MRN: [id number] Date of Birth: 1946-03-12

## 2016-06-02 NOTE — Patient Instructions (Addendum)
AROM: Lateral Neck Flexion    Slowly tilt head toward right shoulder, then the other. Hold each position _5___ seconds.   http://orth.exer.us/297   Copyright  VHI. All rights reserved.  NECK: Cervical Extension    Raise head up. Look toward ceiling.  Copyright  VHI. All rights reserved.  AROM: Neck Rotation    Turn head slowly to look towards your left shoulder. Hold each position ___5_ seconds.   REPEAT THE SEQUENCE OF LATERAL FLEXION TOWARDS THE RIGHT SHOULDER, NECK EXTENSION TO LOOK UP AS FAR AS YOU CAN, THEN ROTATION TO LOOK TOWARDS LEFT SHOULDER.  http://orth.exer.us/295   Copyright  VHI. All rights reserved.

## 2016-06-03 ENCOUNTER — Emergency Department (HOSPITAL_COMMUNITY)
Admission: EM | Admit: 2016-06-03 | Discharge: 2016-06-03 | Disposition: A | Discharge status: Home / Self Care | Payer: Medicare Other | Attending: Emergency Medicine | Admitting: Emergency Medicine

## 2016-06-03 ENCOUNTER — Encounter (HOSPITAL_COMMUNITY): Payer: Self-pay | Admitting: *Deleted

## 2016-06-03 ENCOUNTER — Emergency Department (HOSPITAL_COMMUNITY): Payer: Medicare Other

## 2016-06-03 DIAGNOSIS — Z79899 Other long term (current) drug therapy: Secondary | ICD-10-CM | POA: Insufficient documentation

## 2016-06-03 DIAGNOSIS — Z853 Personal history of malignant neoplasm of breast: Secondary | ICD-10-CM | POA: Insufficient documentation

## 2016-06-03 DIAGNOSIS — R29818 Other symptoms and signs involving the nervous system: Secondary | ICD-10-CM | POA: Diagnosis not present

## 2016-06-03 DIAGNOSIS — G20 Parkinson's disease: Secondary | ICD-10-CM | POA: Insufficient documentation

## 2016-06-03 DIAGNOSIS — N390 Urinary tract infection, site not specified: Secondary | ICD-10-CM | POA: Diagnosis not present

## 2016-06-03 DIAGNOSIS — Z7982 Long term (current) use of aspirin: Secondary | ICD-10-CM | POA: Insufficient documentation

## 2016-06-03 DIAGNOSIS — R4182 Altered mental status, unspecified: Secondary | ICD-10-CM

## 2016-06-03 DIAGNOSIS — R791 Abnormal coagulation profile: Secondary | ICD-10-CM | POA: Insufficient documentation

## 2016-06-03 DIAGNOSIS — I10 Essential (primary) hypertension: Secondary | ICD-10-CM | POA: Insufficient documentation

## 2016-06-03 LAB — PROTIME-INR
INR: 1.07
Prothrombin Time: 13.9 seconds (ref 11.4–15.2)

## 2016-06-03 LAB — URINE MICROSCOPIC-ADD ON: RBC / HPF: NONE SEEN RBC/hpf (ref 0–5)

## 2016-06-03 LAB — CBC
HEMATOCRIT: 35.2 % — AB (ref 36.0–46.0)
Hemoglobin: 11.3 g/dL — ABNORMAL LOW (ref 12.0–15.0)
MCH: 29 pg (ref 26.0–34.0)
MCHC: 32.1 g/dL (ref 30.0–36.0)
MCV: 90.3 fL (ref 78.0–100.0)
Platelets: 327 10*3/uL (ref 150–400)
RBC: 3.9 MIL/uL (ref 3.87–5.11)
RDW: 13.7 % (ref 11.5–15.5)
WBC: 15 10*3/uL — AB (ref 4.0–10.5)

## 2016-06-03 LAB — RAPID URINE DRUG SCREEN, HOSP PERFORMED
AMPHETAMINES: NOT DETECTED
BARBITURATES: NOT DETECTED
BENZODIAZEPINES: NOT DETECTED
COCAINE: NOT DETECTED
Opiates: NOT DETECTED
Tetrahydrocannabinol: NOT DETECTED

## 2016-06-03 LAB — DIFFERENTIAL
Basophils Absolute: 0 10*3/uL (ref 0.0–0.1)
Basophils Relative: 0 %
Eosinophils Absolute: 0.1 10*3/uL (ref 0.0–0.7)
Eosinophils Relative: 0 %
LYMPHS PCT: 8 %
Lymphs Abs: 1.3 10*3/uL (ref 0.7–4.0)
MONO ABS: 0.9 10*3/uL (ref 0.1–1.0)
MONOS PCT: 6 %
NEUTROS ABS: 12.8 10*3/uL — AB (ref 1.7–7.7)
Neutrophils Relative %: 86 %

## 2016-06-03 LAB — I-STAT CHEM 8, ED
BUN: 25 mg/dL — AB (ref 6–20)
CALCIUM ION: 1.13 mmol/L (ref 1.12–1.23)
CHLORIDE: 100 mmol/L — AB (ref 101–111)
Creatinine, Ser: 0.6 mg/dL (ref 0.44–1.00)
GLUCOSE: 110 mg/dL — AB (ref 65–99)
HCT: 34 % — ABNORMAL LOW (ref 36.0–46.0)
Hemoglobin: 11.6 g/dL — ABNORMAL LOW (ref 12.0–15.0)
Potassium: 3.8 mmol/L (ref 3.5–5.1)
Sodium: 137 mmol/L (ref 135–145)
TCO2: 27 mmol/L (ref 0–100)

## 2016-06-03 LAB — ETHANOL

## 2016-06-03 LAB — COMPREHENSIVE METABOLIC PANEL
ALK PHOS: 59 U/L (ref 38–126)
ALT: 31 U/L (ref 14–54)
AST: 27 U/L (ref 15–41)
Albumin: 3.6 g/dL (ref 3.5–5.0)
Anion gap: 8 (ref 5–15)
BUN: 24 mg/dL — ABNORMAL HIGH (ref 6–20)
CALCIUM: 9.4 mg/dL (ref 8.9–10.3)
CO2: 26 mmol/L (ref 22–32)
CREATININE: 0.6 mg/dL (ref 0.44–1.00)
Chloride: 101 mmol/L (ref 101–111)
Glucose, Bld: 114 mg/dL — ABNORMAL HIGH (ref 65–99)
Potassium: 3.7 mmol/L (ref 3.5–5.1)
Sodium: 135 mmol/L (ref 135–145)
Total Bilirubin: 0.4 mg/dL (ref 0.3–1.2)
Total Protein: 6.7 g/dL (ref 6.5–8.1)

## 2016-06-03 LAB — APTT: aPTT: 28 seconds (ref 24–36)

## 2016-06-03 LAB — I-STAT TROPONIN, ED: TROPONIN I, POC: 0 ng/mL (ref 0.00–0.08)

## 2016-06-03 LAB — CBG MONITORING, ED: GLUCOSE-CAPILLARY: 118 mg/dL — AB (ref 65–99)

## 2016-06-03 LAB — URINALYSIS, ROUTINE W REFLEX MICROSCOPIC
BILIRUBIN URINE: NEGATIVE
GLUCOSE, UA: NEGATIVE mg/dL
HGB URINE DIPSTICK: NEGATIVE
Ketones, ur: NEGATIVE mg/dL
Nitrite: NEGATIVE
Protein, ur: NEGATIVE mg/dL
SPECIFIC GRAVITY, URINE: 1.03 (ref 1.005–1.030)
pH: 5.5 (ref 5.0–8.0)

## 2016-06-03 MED ORDER — CEPHALEXIN 500 MG PO CAPS: 500.0000 mg | ORAL_CAPSULE | Freq: Four times a day (QID) | ORAL | 0 refills | Status: AC

## 2016-06-03 MED ORDER — DEXTROSE 5 % IV SOLN
1.0000 g | Freq: Once | INTRAVENOUS | Status: AC
  Administered 2016-06-03: 1 g via INTRAVENOUS
  Filled 2016-06-03: qty 10

## 2016-06-03 NOTE — ED Notes (Addendum)
Family at bedside states this pt's baseline

## 2016-06-03 NOTE — ED Provider Notes (Signed)
[facility] DEPT Provider Note   CSN: [id number] Arrival date & time: 06/03/16  0149  By signing my name below, I, Irene Pap, attest that this documentation has been prepared under the direction and in the presence of Veryl Speak, MD. Electronically Signed: Irene Pap, ED Scribe. 06/03/16. 2:33 AM.  First Provider Contact:  None    History   Chief Complaint Chief Complaint  Patient presents with  . Code Stroke   The history is provided by the patient. No language interpreter was used.  HPI Comments (Level 5 Caveat due to altered mental status): Miranda [name] is a 70 y.o. Female with a hx of breast cancer, HTN, Parkinson's disease, TBI, and progressive supranuclear palsy brought in by EMS who presents to the Emergency Department complaining of AMS onset 3 hours ago. Husband states that pt woke from sleep around 12:45 AM to use the restroom. He found her slumped on the toilet and was "not acting right." Code stroke called pre-hospital arrival.   Past Medical History:  Diagnosis Date  . Breast cancer ([place]) 1998   lumpectomy  . Depression   . Hypertension   . Multiple falls   . Parkinson's disease ([place]) 2012  . Progressive supranuclear palsy [facility])     Patient Active Problem List   Diagnosis Date Noted  . Fall 10/22/2015  . Scalp laceration 10/22/2015  . Multiple fractures of cervical spine ([place]) 10/22/2015  . Parkinson's disease ([place]) 10/22/2015  . TBI (traumatic brain injury) ([place]) 10/20/2015  . Breast cancer, right breast ([place]) 11/20/2012    Past Surgical History:  Procedure Laterality Date  . ANTERIOR RELEASE VERTEBRAL BODY W/ POSTERIOR FUSION  2010   L1-S1  . CESAREAN SECTION  1975  . lumpectomy right Right 1998   stage IA, DCIS with micrinvasion, ER+/PR+, , right breast lower inner quadrant  . SPINAL FUSION      OB History    No data available       Home Medications    Prior to Admission medications   Medication Sig Start Date End Date  Taking? Authorizing Provider  alendronate (FOSAMAX) 70 MG tablet Take 70 mg by mouth every Monday. Take with a full glass of water on an empty stomach.    Historical Provider, MD  Artificial Tear Ointment (EYE LUBRICANT) OINT Apply 1 application to eye at bedtime.    Historical Provider, MD  aspirin EC 81 MG tablet Take 81 mg by mouth daily.    Historical Provider, MD  buPROPion (WELLBUTRIN XL) 300 MG 24 hr tablet Take 300 mg by mouth every morning.     Historical Provider, MD  calcium citrate-vitamin D (CITRACAL+D) 315-200 MG-UNIT tablet Take 2 tablets by mouth daily.    Historical Provider, MD  carbidopa-levodopa (SINEMET IR) 25-100 MG per tablet Take 1 tablet by mouth 3 (three) times daily. Reported on 02/03/2016    Historical Provider, MD  carboxymethylcellulose (REFRESH PLUS) 0.5 % SOLN Apply 1 drop to eye 3 (three) times daily as needed (for dry eyes).    Historical Provider, MD  Coenzyme Q10 (EQL COQ10) 300 MG CAPS Take 2,400 mg by mouth daily.    Historical Provider, MD  HYDROcodone-acetaminophen (NORCO) 10-325 MG per tablet Take 1 tablet by mouth every 6 (six) hours as needed for moderate pain.  11/06/14   Historical Provider, MD  LORazepam (ATIVAN) 0.5 MG tablet Take 0.5 mg by mouth every 8 (eight) hours as needed for anxiety or sleep.  12/22/14   Historical Provider, MD  Multiple  Vitamin (MULTIVITAMIN WITH MINERALS) TABS tablet Take 1 tablet by mouth daily.    Historical Provider, MD  Omega-3 Fatty Acids (FISH OIL BURP-LESS PO) Take 5 mLs by mouth daily.    Historical Provider, MD  PARoxetine (PAXIL) 20 MG tablet Take 20 mg by mouth at bedtime.  01/07/14   Historical Provider, MD  polyethylene glycol (MIRALAX / GLYCOLAX) packet Take 17 g by mouth daily.    Historical Provider, MD  valsartan (DIOVAN) 80 MG tablet Take 80 mg by mouth daily.    Historical Provider, MD  zolpidem (AMBIEN) 10 MG tablet Take 10 mg by mouth at bedtime.  08/28/13   Historical Provider, MD    Family History Family  History  Problem Relation Age of Onset  . Stroke Father   . Cancer Sister   . Cancer Son 67    leiomyosarcoma    Social History Social History  Substance Use Topics  . Smoking status: Never Smoker  . Smokeless tobacco: Never Used  . Alcohol use 0.0 oz/week     Allergies   Amantadines; Codeine; Demerol [meperidine]; and Valium [diazepam]   Review of Systems Review of Systems  Unable to perform ROS: Mental status change   Physical Exam Updated Vital Signs BP 129/79 (BP Location: Right Arm)   Pulse 97   Resp 21   Wt 85 lb 5.1 oz (38.7 kg)   SpO2 98%   BMI 16.12 kg/m   Physical Exam  Constitutional: She appears cachectic. She appears ill.  Chronically ill appearing  HENT:  Head: Normocephalic and atraumatic.  Eyes: Conjunctivae are normal.  Neck: Normal range of motion. Neck supple.  Cardiovascular: Normal rate, regular rhythm, normal heart sounds and intact distal pulses.   No murmur heard. Pulmonary/Chest: Effort normal and breath sounds normal. No respiratory distress.  Abdominal: Soft. There is no tenderness.  Musculoskeletal: Normal range of motion. She exhibits no edema or tenderness.  Neurological: She is alert.  She is somnolent but arousable; she speaks very softly to the point that she is inaudible.  Neurologic exam is difficult secondary to baseline medical condition  Skin: Skin is warm.  Psychiatric: She has a normal mood and affect. Her behavior is normal.  Nursing note and vitals reviewed.    ED Treatments / Results  DIAGNOSTIC STUDIES: Oxygen Saturation is 98% on RA, normal by my interpretation.    COORDINATION OF CARE: 2:32 AM-labs and CT scan  Labs (all labs ordered are listed, but only abnormal results are displayed) Labs Reviewed  CBC - Abnormal; Notable for the following:       Result Value   WBC 15.0 (*)    Hemoglobin 11.3 (*)    HCT 35.2 (*)    All other components within normal limits  DIFFERENTIAL - Abnormal; Notable for the  following:    Neutro Abs 12.8 (*)    All other components within normal limits  COMPREHENSIVE METABOLIC PANEL - Abnormal; Notable for the following:    Glucose, Bld 114 (*)    BUN 24 (*)    All other components within normal limits  URINALYSIS, ROUTINE W REFLEX MICROSCOPIC (NOT AT [facility]) - Abnormal; Notable for the following:    APPearance CLOUDY (*)    Leukocytes, UA LARGE (*)    All other components within normal limits  URINE MICROSCOPIC-ADD ON - Abnormal; Notable for the following:    Squamous Epithelial / LPF 0-5 (*)    Bacteria, UA RARE (*)    All other components within  normal limits  I-STAT CHEM 8, ED - Abnormal; Notable for the following:    Chloride 100 (*)    BUN 25 (*)    Glucose, Bld 110 (*)    Hemoglobin 11.6 (*)    HCT 34.0 (*)    All other components within normal limits  CBG MONITORING, ED - Abnormal; Notable for the following:    Glucose-Capillary 118 (*)    All other components within normal limits  ETHANOL  PROTIME-INR  APTT  URINE RAPID DRUG SCREEN, HOSP PERFORMED  I-STAT TROPOININ, ED    EKG  EKG Interpretation None       Radiology Ct Head Code Stroke W/o Cm  Result Date: 06/03/2016 CLINICAL DATA:  Code stroke. Altered mental status, "not acting right". History of breast cancer, Parkinson's disease, progress supranuclear palsy. EXAM: CT HEAD WITHOUT CONTRAST TECHNIQUE: Contiguous axial images were obtained from the base of the skull through the vertex without intravenous contrast. COMPARISON:  CT HEAD January 21, 2016 FINDINGS: INTRACRANIAL CONTENTS: Moderate ventriculomegaly on the basis of global parenchymal brain volume loss. No intraparenchymal hemorrhage, mass effect nor midline shift. Patchy supratentorial white matter hypodensities are within normal range for patient's age and though non-specific likely represent chronic small vessel ischemic disease. No acute large vascular territory infarcts. No abnormal extra-axial fluid collections. Basal  cisterns are patent. Moderate calcific atherosclerosis of the carotid siphons. ORBITS: The included ocular globes and orbital contents are non-suspicious. SINUSES: The mastoid aircells and included paranasal sinuses are well-aerated. SKULL/SOFT TISSUES: No skull fracture. No significant soft tissue swelling. ASPECTS [facility] Stroke Program Early CT Score, http://[email or web address].com) - Ganglionic level infarction (caudate, lentiform nuclei, internal capsule, insula, M1-M3 cortex): 7 - Supraganglionic infarction (M4-M6 cortex): 3 Total score (0-10 with 10 being normal): 10 IMPRESSION: 1. No acute intracranial process. Stable moderate global parenchymal brain volume loss and mild chronic small vessel ischemic disease. 2. ASPECTS score 10. Acute findings discussed with and reconfirmed by Dr.Conrad Zajkowski on 06/03/2016 at 2:15 am. Electronically Signed   By: Elon Alas M.D.   On: 06/03/2016 02:40    Procedures Procedures (including critical care time)  Medications Ordered in ED Medications - No data to display   Initial Impression / Assessment and Plan / ED Course  I have reviewed the triage vital signs and the nursing notes.  Pertinent labs & imaging results that were available during my care of the patient were reviewed by me and considered in my medical decision making (see chart for details).  Clinical Course      Final Clinical Impressions(s) / ED Diagnoses   Final diagnoses:  None  Patient with past medical history of progressive supranuclear palsy brought by EMS for evaluation of altered mental status. According to the husband she was found approximately 2 hours after going to bed to be slumped over on the toilet and not responding appropriately. EMS was called and the patient was transported here as a code stroke. Her head CT was negative and neurology consultation was obtained. Dr. Leonel Ramsay does not recommend TPA. Shortly after arriving at the emergency department, the patient  regained consciousness and is now awake and alert. Her workup is otherwise unremarkable with the exception of a possible urinary tract infection. This will be treated with Rocephin.  Dr. Leonel Ramsay discussed the disposition with the husband and wife who were offered admission, however prefer to go home and follow-up with her neurologist as an outpatient. The possibility of a seizure was raised and an outpatient EEG appears to be  the recommendation of neurology. She will be discharged with Keflex and outpatient follow-up.   I personally performed the services described in this documentation, which was scribed in my presence. The recorded information has been reviewed and is accurate.    New Prescriptions New Prescriptions   No medications on file     Veryl Speak, MD 06/03/16 [phone]

## 2016-06-03 NOTE — ED Notes (Signed)
Pt's caregiver verbalized understanding of discharge instructions and follow-up care.

## 2016-06-03 NOTE — ED Notes (Signed)
Neurologist at bedside. 

## 2016-06-03 NOTE — Discharge Instructions (Signed)
Keflex as prescribed.  Follow-up with your neurologist in the next few days to discuss an outpatient EEG.  Return to the emergency department if your symptoms significantly worsen or change.

## 2016-06-03 NOTE — ED Triage Notes (Signed)
Per EMS: pt coming from home with c/o "not acting right" per the pt's husband. Husband reported to EMS pt went to bed around 2245, pt woke up around 0045 needing to use the restroom. Husband stated pt was slumped on the toilet and "not acting right". Pt has hx of PSP disorder. Pt will follow commands, answer questions.

## 2016-06-03 NOTE — ED Notes (Signed)
Family at bedside. 

## 2016-06-03 NOTE — Consult Note (Signed)
Neurology Consultation Reason for Consult: Transient episode of unresponsiveness Referring Physician: Stark Jock, D  CC: Transient episode of unresponsiveness  History is obtained from: Patient, husband  HPI: Miranda [name] is a 70 y.o. female with a history of PSP who presents with about a 20 minute episode of unresponsiveness. Her husband states that she fell while going towards the bathroom. He states that she seemed to stay further normal, and contorted in a funny way. He asked her questions, and she would not respond to him. She continued to not respond to him for approximately 20 minutes. When asked about her stiffness, he states that it is hard to tell how abnormal it really was because she is so stiff all the time. Following about 30 minutes, she began to respond more briskly and at this point he feels that she has returned to her normal self.   LKW: [id number] tpa given?: no, rapidly improving symptoms    ROS: A 14 point ROS was performed and is negative except as noted in the HPI.   Past Medical History:  Diagnosis Date  . Breast cancer ([place]) 1998   lumpectomy  . Depression   . Hypertension   . Multiple falls   . Parkinson's disease ([place]) 2012  . Progressive supranuclear palsy ([facility])      Family History  Problem Relation Age of Onset  . Stroke Father   . Cancer Sister   . Cancer Son 68    leiomyosarcoma     Social History:  reports that she has never smoked. She has never used smokeless tobacco. She reports that she drinks alcohol. She reports that she does not use drugs.   Exam: Current vital signs: BP 146/81   Pulse 100   Temp 98.4 F (36.9 C)   Resp 19   Wt 38.7 kg (85 lb 5.1 oz)   SpO2 98%   BMI 16.12 kg/m  Vital signs in last 24 hours: Temp:  [98.4 F (36.9 C)] 98.4 F (36.9 C) (08/03 0310) Pulse Rate:  [91-100] 100 (08/03 0245) Resp:  [18-21] 19 (08/03 0245) BP: (124-146)/(64-81) 146/81 (08/03 0245) SpO2:  [97 %-98 %] 98 % (08/03 0245) Weight:  [38.7  kg (85 lb 5.1 oz)] 38.7 kg (85 lb 5.1 oz) (08/03 0211)   Physical Exam  Constitutional: Appears Chronically ill Psych: Flattened affect Eyes: No scleral injection HENT: No OP obstrucion, drooling Head: Normocephalic.  Cardiovascular: Normal rate and regular rhythm.  Respiratory: Effort normal and breath sounds normal to anterior ascultation GI: Soft.  No distension. There is no tenderness.  Skin: WDI  Neuro: Mental Status: Patient is awake, alert, she is unable to speak much, but she does interact with the speech board. He is able to follow commands and answer questions Cranial Nerves: II: Visual Fields are full. Pupils are equal, round, and reactive to light.   III,IV, VI: She has severe limitations of vertical gaze, some limitation are not as severe of horizontal gaze bilaterally. V: Facial sensation is symmetric to temperature VII: Facial movement is decreased bilaterally Motor: Tone is increased throughout Bulk is normal. 5/5 strength was present in all four extremities, but with slow movements due to the increased tone. Sensory: Sensation is symmetric to light touch and temperature in the arms and legs. Cerebellar: No clear ataxia, but difficult for the patient to fully cooperate.  I have reviewed labs in epic and the results pertinent to this consultation are: CMP-unremarkable  I have reviewed the images obtained: CT head-no acute findings  Impression: 70 year old female with transient episode of on responsiveness in the setting of neurodegenerative disease as well as previous history of head injury. My suspicion is that this could've been a seizure, however given her debilitated condition and propensity to get s ide effects of medications, I would not favor starting an antiepileptic given the degree of uncertainty that I have. A could've also been syncope with prolonged recovery, effects of ambien/sleepiness. I think the description is less likely to be TIA.  I discussed  options with the husband including admission to check an EEG, however I do not feel this is really necessary given that this is her first event. If she were to have further events I would favor starting an antiepileptic.  Recommendations: 1) EEG, could be done as an outpatient. 2) if the EEG showed epileptiform potentials, or she were to have further events I would favor starting an antiepileptic. 3) follow-up with outpatient neurologist.   Roland Rack, MD [facility] [phone]  If 7pm- 7am, please page neurology on call as listed in [place].

## 2016-06-03 NOTE — ED Notes (Signed)
Husband just stated pt took Ambien at 2230 tonight

## 2016-06-15 ENCOUNTER — Ambulatory Visit (INDEPENDENT_AMBULATORY_CARE_PROVIDER_SITE_OTHER): Payer: Medicare Other | Admitting: Licensed Clinical Social Worker

## 2016-06-15 DIAGNOSIS — F331 Major depressive disorder, recurrent, moderate: Secondary | ICD-10-CM | POA: Diagnosis not present

## 2016-06-29 ENCOUNTER — Ambulatory Visit (INDEPENDENT_AMBULATORY_CARE_PROVIDER_SITE_OTHER): Payer: Medicare Other | Admitting: Licensed Clinical Social Worker

## 2016-06-29 DIAGNOSIS — F3341 Major depressive disorder, recurrent, in partial remission: Secondary | ICD-10-CM

## 2016-07-06 ENCOUNTER — Ambulatory Visit (INDEPENDENT_AMBULATORY_CARE_PROVIDER_SITE_OTHER): Payer: Medicare Other | Admitting: Licensed Clinical Social Worker

## 2016-07-06 DIAGNOSIS — F3341 Major depressive disorder, recurrent, in partial remission: Secondary | ICD-10-CM

## 2016-07-13 ENCOUNTER — Ambulatory Visit: Payer: Medicare Other | Admitting: Licensed Clinical Social Worker

## 2016-12-23 IMAGING — CR DG CHEST 2V
2 series · 2 of 2 positions shown · non-contrast
Comparison: 08/24/2015

CLINICAL DATA: Altered mental status x 1 day, hx breast ca, hx
hypertension

EXAM:
CHEST  2 VIEW

[chest pa]
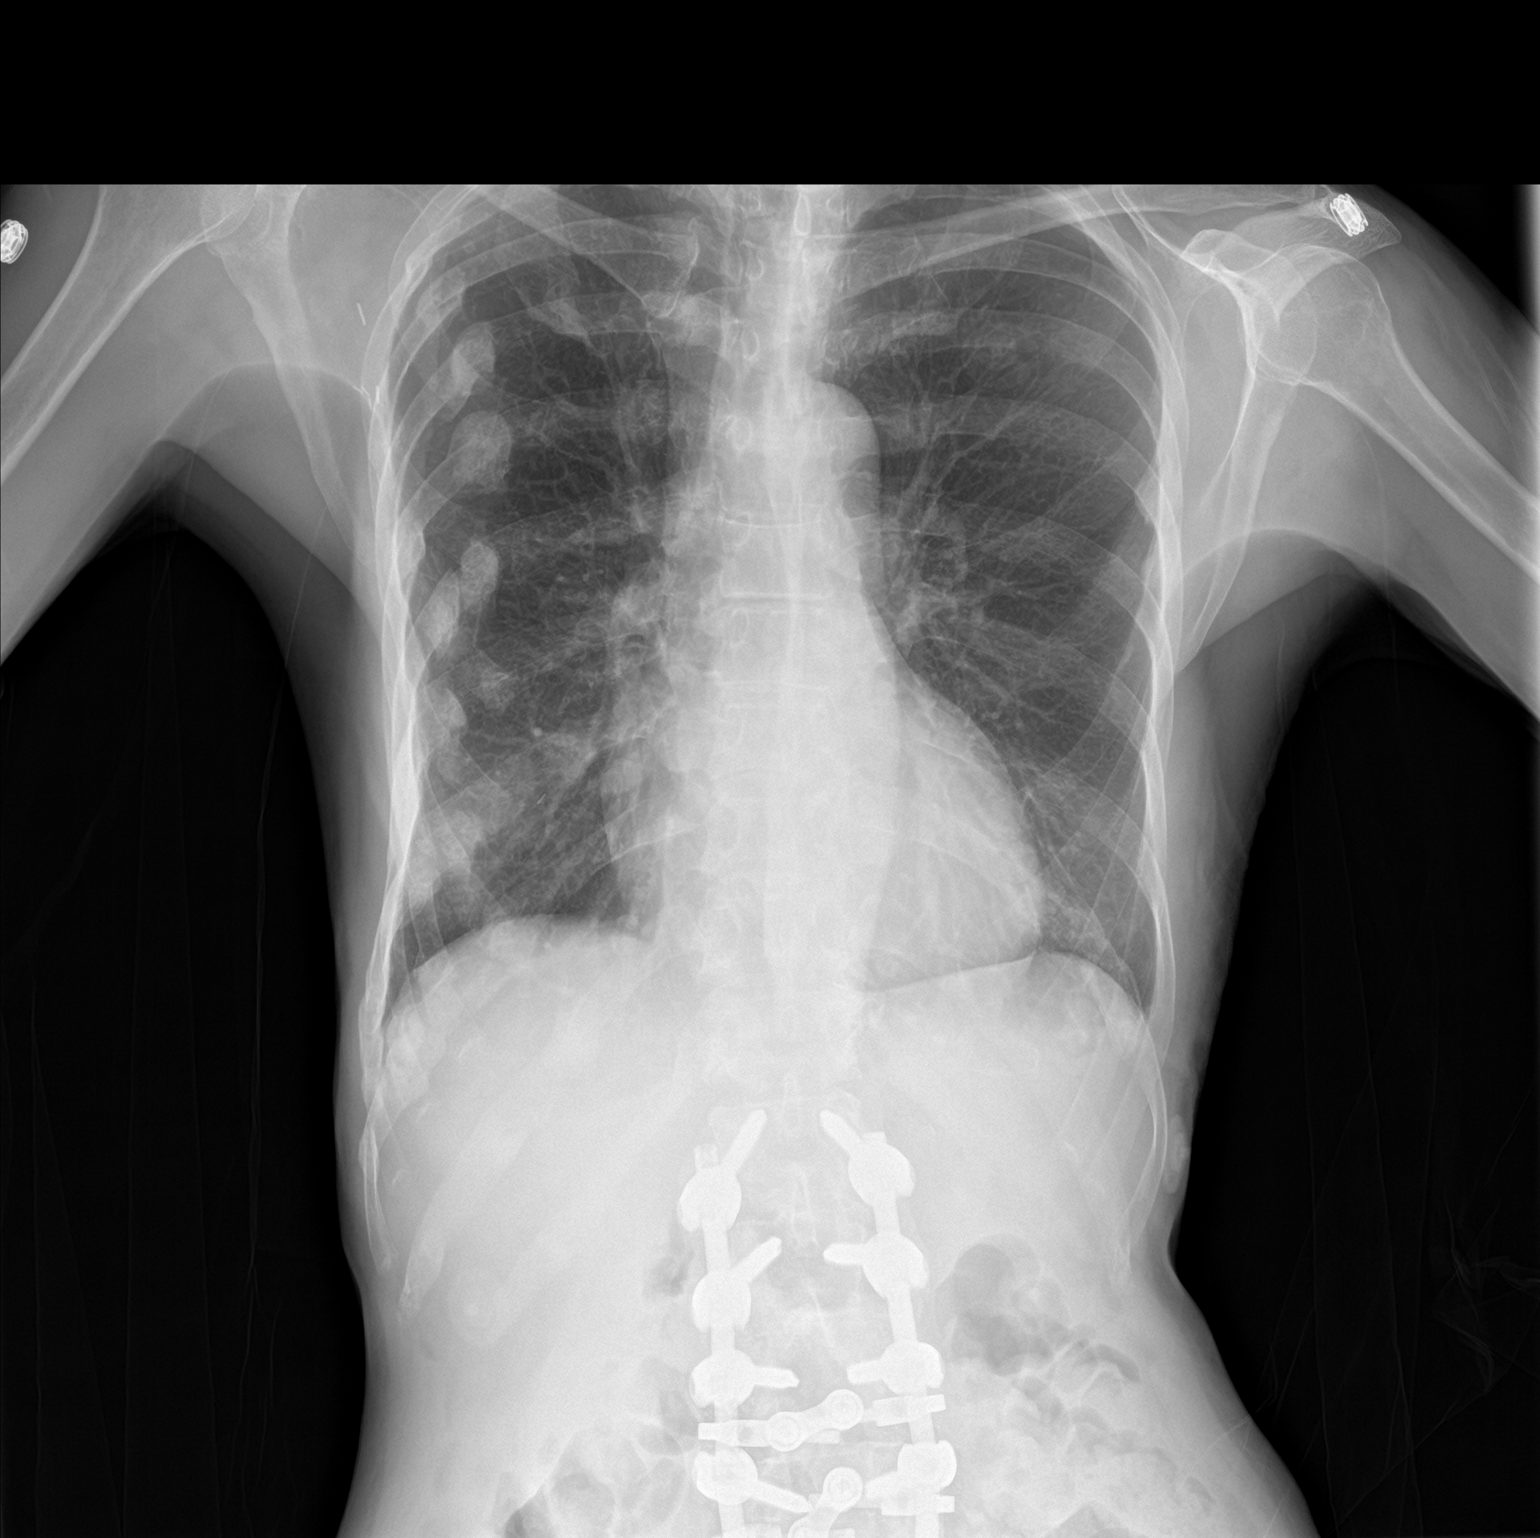

[chest lat]
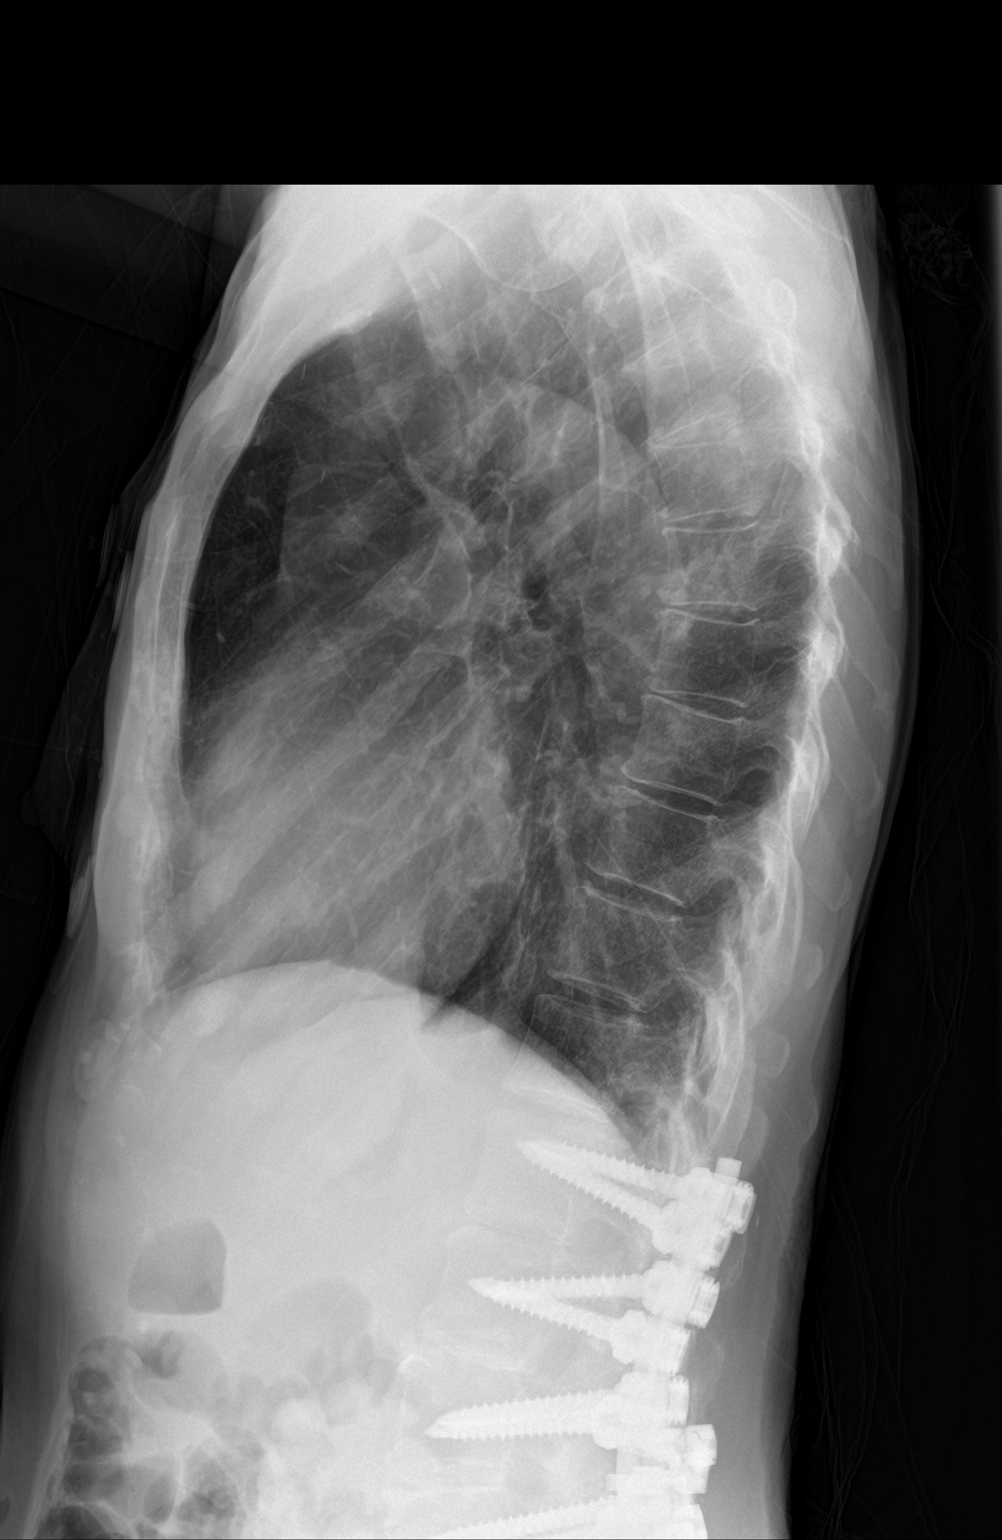

[2 of 2 positions shown; findings below may reference images not displayed]

FINDINGS: Lumbar spine fixation. Mild osteopenia. Multiple remote right rib
fractures. The distribution favors primarily a posttraumatic
etiology. Patient rotated minimally right. Midline trachea. Normal
heart size and mediastinal contours. No pleural effusion or
pneumothorax. Suspect remote trauma to superior lateral left-sided
ribs, with increased osseous density. Clear lungs.
IMPRESSION: No acute cardiopulmonary disease.

Multiple right remote rib fractures. Suspect remote left rib
fractures. Primarily felt to be posttraumatic. No convincing
evidence of osseous metastasis.

## 2017-01-22 ENCOUNTER — Emergency Department (HOSPITAL_COMMUNITY): Payer: Medicare Other

## 2017-01-22 ENCOUNTER — Emergency Department (HOSPITAL_COMMUNITY)
Admission: EM | Admit: 2017-01-22 | Discharge: 2017-01-22 | Disposition: A | Discharge status: Home / Self Care | Payer: Medicare Other | Attending: Emergency Medicine | Admitting: Emergency Medicine

## 2017-01-22 ENCOUNTER — Encounter (HOSPITAL_COMMUNITY): Payer: Self-pay | Admitting: Emergency Medicine

## 2017-01-22 DIAGNOSIS — Z853 Personal history of malignant neoplasm of breast: Secondary | ICD-10-CM | POA: Diagnosis not present

## 2017-01-22 DIAGNOSIS — Z7982 Long term (current) use of aspirin: Secondary | ICD-10-CM | POA: Insufficient documentation

## 2017-01-22 DIAGNOSIS — G20 Parkinson's disease: Secondary | ICD-10-CM | POA: Insufficient documentation

## 2017-01-22 DIAGNOSIS — R4182 Altered mental status, unspecified: Secondary | ICD-10-CM | POA: Diagnosis not present

## 2017-01-22 DIAGNOSIS — I10 Essential (primary) hypertension: Secondary | ICD-10-CM | POA: Insufficient documentation

## 2017-01-22 LAB — URINALYSIS, ROUTINE W REFLEX MICROSCOPIC
Bacteria, UA: NONE SEEN
Bilirubin Urine: NEGATIVE
GLUCOSE, UA: NEGATIVE mg/dL
Hgb urine dipstick: NEGATIVE
KETONES UR: NEGATIVE mg/dL
Nitrite: NEGATIVE
PH: 6 (ref 5.0–8.0)
Protein, ur: NEGATIVE mg/dL
SPECIFIC GRAVITY, URINE: 1.009 (ref 1.005–1.030)

## 2017-01-22 LAB — COMPREHENSIVE METABOLIC PANEL
ALK PHOS: 51 U/L (ref 38–126)
ALT: 20 U/L (ref 14–54)
AST: 23 U/L (ref 15–41)
Albumin: 3.4 g/dL — ABNORMAL LOW (ref 3.5–5.0)
Anion gap: 10 (ref 5–15)
BUN: 17 mg/dL (ref 6–20)
CALCIUM: 9.3 mg/dL (ref 8.9–10.3)
CO2: 29 mmol/L (ref 22–32)
CREATININE: 0.63 mg/dL (ref 0.44–1.00)
Chloride: 100 mmol/L — ABNORMAL LOW (ref 101–111)
Glucose, Bld: 89 mg/dL (ref 65–99)
Potassium: 3.8 mmol/L (ref 3.5–5.1)
Sodium: 139 mmol/L (ref 135–145)
Total Bilirubin: 0.7 mg/dL (ref 0.3–1.2)
Total Protein: 6.3 g/dL — ABNORMAL LOW (ref 6.5–8.1)

## 2017-01-22 LAB — CBC WITH DIFFERENTIAL/PLATELET
Basophils Absolute: 0 10*3/uL (ref 0.0–0.1)
Basophils Relative: 1 %
Eosinophils Absolute: 0.2 10*3/uL (ref 0.0–0.7)
Eosinophils Relative: 3 %
HEMATOCRIT: 36.3 % (ref 36.0–46.0)
Hemoglobin: 11.6 g/dL — ABNORMAL LOW (ref 12.0–15.0)
LYMPHS ABS: 1.4 10*3/uL (ref 0.7–4.0)
LYMPHS PCT: 26 %
MCH: 28.6 pg (ref 26.0–34.0)
MCHC: 32 g/dL (ref 30.0–36.0)
MCV: 89.6 fL (ref 78.0–100.0)
Monocytes Absolute: 0.5 10*3/uL (ref 0.1–1.0)
Monocytes Relative: 10 %
NEUTROS ABS: 3.2 10*3/uL (ref 1.7–7.7)
NEUTROS PCT: 60 %
Platelets: 331 10*3/uL (ref 150–400)
RBC: 4.05 MIL/uL (ref 3.87–5.11)
RDW: 13.8 % (ref 11.5–15.5)
WBC: 5.2 10*3/uL (ref 4.0–10.5)

## 2017-01-22 LAB — TROPONIN I

## 2017-01-22 NOTE — ED Triage Notes (Signed)
Per EMS: [id number] pt woke up husband who takes care of him full time. Pt taken to bathroom. Pt not responding normally. Pt currently acting normal. Pt talks by squeezes. No N, V, weakness, pain. Pt felt warm on scene. Pt 92% on RA. Pt put on 4L. [place] wrapped currently using kerlex. Pt has PSP.

## 2017-01-22 NOTE — ED Notes (Signed)
Placed pt on bed pan.

## 2017-01-22 NOTE — ED Notes (Signed)
Pt to xray and CT

## 2017-01-22 NOTE — ED Provider Notes (Signed)
[facility] DEPT Provider Note   CSN: [id number] Arrival date & time: 01/22/17  0301  By signing my name below, I, Arianna Nassar, attest that this documentation has been prepared under the direction and in the presence of Jola Schmidt, MD.  Electronically Signed: Julien Nordmann, ED Scribe. 01/22/17. 8:04 AM.    History   Chief Complaint Chief Complaint  Patient presents with  . Altered Mental Status   The history is provided by the EMS personnel and the spouse. No language interpreter was used.   LEVEL V CAVEAT: HPI and ROS limited due to pt is non-verbal at baseline.  HPI Comments: Ellisha Bankson is a 71 y.o. female brought in by ambulance, who has a PMhx of breast cancer, HTN, TBI, and parkinson's disease presents to the Emergency Department presenting with AMS x 2 hours. LSN was ~ 1:30 am. Pt is non-verbal at baseline and communicates through squeezes. Per husband, ~ 1:30 this morning, she woke up him to use the restroom. He took her to the restroom and he states she did not respond for ~ 7-8 minutes. Per husband, on a normal evening she will give him a "thumbs up". Husband says pt has had a similar episode in the past. He expresses pt has been declining for the past 4 years. She has bilateral ankle swelling at baseline. He says they recently just got back from [place] (flown privately) ~ 5 days ago which is something new. He also notes she had a busy day today. He says she has having normal PO intake today. Husband denies rash, nausea, diarrhea, vomiting, or any other pain.  Past Medical History:  Diagnosis Date  . Breast cancer ([place]) 1998   lumpectomy  . Depression   . Hypertension   . Multiple falls   . Parkinson's disease ([place]) 2012  . 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 Fall 10/22/2015  . Scalp laceration 10/22/2015  . Multiple fractures of cervical spine ([place]) 10/22/2015  . Parkinson's disease ([place])  10/22/2015  . TBI (traumatic brain injury) ([place]) 10/20/2015  . Breast cancer, right breast ([place]) 11/20/2012    Past Surgical History:  Procedure Laterality Date  . ANTERIOR RELEASE VERTEBRAL BODY W/ POSTERIOR FUSION  2010   L1-S1  . CESAREAN SECTION  1975  . lumpectomy right Right 1998   stage IA, DCIS with micrinvasion, ER+/PR+, , right breast lower inner quadrant  . SPINAL FUSION      OB History    No data available       Home Medications    Prior to Admission medications   Medication Sig Start Date End Date Taking? Authorizing Provider  alendronate (FOSAMAX) 70 MG tablet Take 70 mg by mouth every Monday. Take with a full glass of water on an empty stomach.   Yes Historical Provider, MD  aspirin EC 81 MG tablet Take 81 mg by mouth daily.   Yes Historical Provider, MD  buPROPion (WELLBUTRIN XL) 300 MG 24 hr tablet Take 300 mg by mouth every morning.    Yes Historical Provider, MD  calcium citrate-vitamin D (CITRACAL+D) 315-200 MG-UNIT tablet Take 2 tablets by mouth daily.   Yes Historical Provider, MD  Coenzyme Q10 (EQL COQ10) 300 MG CAPS Take 2,400 mg by mouth daily.   Yes Historical Provider, MD  glycopyrrolate (ROBINUL) 1 MG tablet Take 1 mg by mouth 2 (two) times daily. 12/22/16  Yes Historical Provider, MD  LORazepam (ATIVAN) 0.5  MG tablet Take 0.5 mg by mouth every 8 (eight) hours as needed for anxiety or sleep.  12/22/14  Yes Historical Provider, MD  Multiple Vitamin (MULTIVITAMIN WITH MINERALS) TABS tablet Take 1 tablet by mouth daily.   Yes Historical Provider, MD  Omega-3 Fatty Acids (FISH OIL BURP-LESS PO) Take 5 mLs by mouth daily.   Yes Historical Provider, MD  OXYCODONE ER PO Take 1 tablet by mouth 3 (three) times daily.   Yes Historical Provider, MD  PARoxetine (PAXIL) 30 MG tablet Take 30 mg by mouth daily.   Yes Historical Provider, MD  polyethylene glycol (MIRALAX / GLYCOLAX) packet Take 17 g by mouth daily.   Yes Historical Provider, MD  valsartan (DIOVAN) 80 MG  tablet Take 80 mg by mouth daily.   Yes Historical Provider, MD  zolpidem (AMBIEN) 10 MG tablet Take 10 mg by mouth at bedtime.  08/28/13  Yes Historical Provider, MD  cephALEXin (KEFLEX) 500 MG capsule Take 1 capsule (500 mg total) by mouth 4 (four) times daily. Patient not taking: Reported on 01/22/2017 06/03/16   Veryl Speak, MD    Family History Family History  Problem Relation Age of Onset  . Stroke Father   . Cancer Sister   . Cancer Son 76    leiomyosarcoma    Social History Social History  Substance Use Topics  . Smoking status: Never Smoker  . Smokeless tobacco: Never Used  . Alcohol use 0.0 oz/week     Allergies   Amantadines; Codeine; Demerol [meperidine]; and Valium [diazepam]   Review of Systems Review of Systems   LEVEL V CAVEAT: HPI and ROS limited due to pt is non-verbal at baseline.   Physical Exam Updated Vital Signs BP 137/70   Pulse 82   Temp 97.2 F (36.2 C) (Rectal)   Resp 15   Ht 5\' 1"  (1.549 m)   Wt 75 lb (34 kg)   SpO2 98%   BMI 14.17 kg/m   Physical Exam  Constitutional:  Chronically ill-appearing  HENT:  Head: Normocephalic and atraumatic.  Eyes: EOM are normal. Pupils are equal, round, and reactive to light.  Neck: Normal range of motion. Neck supple.  Cardiovascular: Normal rate.   Pulmonary/Chest: Effort normal and breath sounds normal.  Abdominal: Soft. She exhibits no distension. There is no tenderness.  Musculoskeletal: Normal range of motion.  Neurological:  Opens eyes to voice.  Follows simple commands.  Moves all 4 extremities equally.  No obvious facial droop.  Skin: Skin is warm and dry.  Psychiatric: She has a normal mood and affect. Judgment normal.  Nursing note and vitals reviewed.    ED Treatments / Results  DIAGNOSTIC STUDIES: .  COORDINATION OF CARE:  .  Labs (all labs ordered are listed, but only abnormal results are displayed) Labs Reviewed  CBC WITH DIFFERENTIAL/PLATELET - Abnormal; Notable for  the following:       Result Value   Hemoglobin 11.6 (*)    All other components within normal limits  COMPREHENSIVE METABOLIC PANEL - Abnormal; Notable for the following:    Chloride 100 (*)    Total Protein 6.3 (*)    Albumin 3.4 (*)    All other components within normal limits  URINALYSIS, ROUTINE W REFLEX MICROSCOPIC - Abnormal; Notable for the following:    Color, Urine STRAW (*)    Leukocytes, UA MODERATE (*)    Squamous Epithelial / LPF 0-5 (*)    All other components within normal limits  URINE CULTURE  TROPONIN I    EKG  EKG Interpretation  Date/Time:  Saturday January 22 2017 04:54:15 EDT Ventricular Rate:  82 PR Interval:    QRS Duration: 85 QT Interval:  374 QTC Calculation: 437 R Axis:   15 Text Interpretation:  Sinus rhythm Nonspecific repol abnormality, diffuse leads No significant change was found Confirmed by Kirke Breach  MD, Lennette Bihari ([id number]) on 01/22/2017 5:38:36 AM       Radiology Dg Chest 2 View  Result Date: 01/22/2017 CLINICAL DATA:  71 year old female with altered mental status. EXAM: CHEST  2 VIEW COMPARISON:  Chest radiograph dated 09/04/2015 FINDINGS: Evaluation is very limited due to patient positioning and superimposition of the patient's face over the left upper lung field. No definite focal consolidation. There is no pleural effusion. No pneumothorax identified. There is mild cardiomegaly. There are multiple old healed right rib fractures. Old appearing left lateral rib fractures seen as was seen on the prior radiograph. A fracture involving the left anterior lower rib appears new compared to the prior radiograph but also likely old or subacute. No definite acute fracture identified. Partially visualized lumbar fixation hardware. IMPRESSION: Limited study. No definite acute cardiopulmonary process identified. Old appearing bilateral rib fracture. No definite acute fracture noted. Electronically Signed   By: Anner Crete M.D.   On: 01/22/2017 05:37   Ct Head  Wo Contrast  Result Date: 01/22/2017 CLINICAL DATA:  71 year old female with altered mental status EXAM: CT HEAD WITHOUT CONTRAST TECHNIQUE: Contiguous axial images were obtained from the base of the skull through the vertex without intravenous contrast. COMPARISON:  Head CT dated 06/03/2016 FINDINGS: Evaluation is limited due to positioning of the patient as well as streak artifact. Brain: There is moderate age-related atrophy and chronic microvascular ischemic changes. There is no acute intracranial hemorrhage. No mass effect or midline shift noted. No extra-axial fluid collection. Vascular: No hyperdense vessel or unexpected calcification. Skull: Normal. Negative for fracture or focal lesion. Sinuses/Orbits: No acute finding. Other: None IMPRESSION: 1. No acute intracranial hemorrhage. 2. Moderate age-related atrophy and chronic microvascular ischemic changes. If symptoms persist, and there are no contraindications, MRI may provide better evaluation if clinically indicated. Electronically Signed   By: Anner Crete M.D.   On: 01/22/2017 06:18    Procedures Procedures (including critical care time)  Medications Ordered in ED Medications - No data to display   Initial Impression / Assessment and Plan / ED Course  I have reviewed the triage vital signs and the nursing notes.  Pertinent labs & imaging results that were available during my care of the patient were reviewed by me and considered in my medical decision making (see chart for details).     Patient underwent evaluation emergency department.  She had returned to baseline mental status while in the ER.  No seizure-like activity was noted however I do wonder about the possibility of seizure with postictal period.  She's been worked up once before for this including with an EEG which was negative.  Her event per the husband was similar to this and was approximate 6 months ago.  She has a neurology team at [facility] as well as [facility].  She will follow back up with her team at [facility].  She is returned to baseline mental status.  No indication for additional workup at this time  Final Clinical Impressions(s) / ED Diagnoses   Final diagnoses:  Altered mental status, unspecified altered mental status type   I personally performed the services described  in this documentation, which was scribed in my presence. The recorded information has been reviewed and is accurate.     New Prescriptions New Prescriptions   No medications on file     Jola Schmidt, MD 01/22/17 [phone]

## 2017-01-23 LAB — URINE CULTURE

## 2017-04-14 ENCOUNTER — Encounter (HOSPITAL_COMMUNITY): Payer: Self-pay

## 2017-04-14 ENCOUNTER — Emergency Department (HOSPITAL_COMMUNITY)
Admission: EM | Admit: 2017-04-14 | Discharge: 2017-04-15 | Disposition: A | Discharge status: Home / Self Care | Payer: Medicare Other | Attending: Emergency Medicine | Admitting: Emergency Medicine

## 2017-04-14 DIAGNOSIS — Z7982 Long term (current) use of aspirin: Secondary | ICD-10-CM | POA: Insufficient documentation

## 2017-04-14 DIAGNOSIS — Z885 Allergy status to narcotic agent status: Secondary | ICD-10-CM | POA: Insufficient documentation

## 2017-04-14 DIAGNOSIS — Z853 Personal history of malignant neoplasm of breast: Secondary | ICD-10-CM | POA: Diagnosis not present

## 2017-04-14 DIAGNOSIS — G231 Progressive supranuclear ophthalmoplegia [Steele-Richardson-Olszewski]: Secondary | ICD-10-CM | POA: Insufficient documentation

## 2017-04-14 DIAGNOSIS — R404 Transient alteration of awareness: Secondary | ICD-10-CM | POA: Insufficient documentation

## 2017-04-14 DIAGNOSIS — Z79899 Other long term (current) drug therapy: Secondary | ICD-10-CM | POA: Diagnosis not present

## 2017-04-14 DIAGNOSIS — G20 Parkinson's disease: Secondary | ICD-10-CM | POA: Insufficient documentation

## 2017-04-14 DIAGNOSIS — I10 Essential (primary) hypertension: Secondary | ICD-10-CM | POA: Insufficient documentation

## 2017-04-14 DIAGNOSIS — M62838 Other muscle spasm: Secondary | ICD-10-CM | POA: Diagnosis present

## 2017-04-14 NOTE — ED Triage Notes (Signed)
Pt comes via [facility] pt has had constant muscle contractions since around 830 pm tonight, pt follow commands but nonverbal, pt has hx of progressive supranuclear palsy. According to husband it happens several times a week but worse today.

## 2017-04-14 NOTE — ED Provider Notes (Signed)
[facility] DEPT Provider Note   CSN: [id number] Arrival date & time: 04/14/17  2232 By signing my name below, I, Miranda [name], attest that this documentation has been prepared under the direction and in the presence of Miranda Morgan, MD . Electronically Signed: Dyke [name], Scribe. 04/15/2017. 12:26 AM.   History   Chief Complaint Chief Complaint  Patient presents with  . Spasms   LEVEL 5 CAVEAT: HPI AND ROS ARE LIMITED AS PT IS NONVERBAL  HPI Miranda [name] is a 71 y.o. female with a history of Parkinson's disease, progressive supranuclear palsy, and TBI who presents to the Emergency Department complaining of frequent muscle spasms onset tonight at 9 pm. Per husband, pt was continuously sniffing and exhaling deeply for about one hour tonight after dinner. Husband states she has similar episodes ~3 times per week, but these typically last about 5 minutes before resolving . After this sniffing episode, husband reports that pt seemed drowsy, but this has improved while in the ED. She is non-verbal at baseline, and is able to communicate through spelling out words.Pt initially indicated to her husband she wanted to come to the hospital tonight, but now feels as if she is back to normal. Pt received Botox injections yesterday but has not recently started any new medications.  Pt and husband deny any pain, cough, fever, diarrhea, or headaches.  The history is provided by the spouse. No language interpreter was used.   Past Medical History:  Diagnosis Date  . Breast cancer ([place]) 1998   lumpectomy  . Depression   . Hypertension   . Multiple falls   . Parkinson's disease ([place]) 2012  . 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 Fall 10/22/2015  . Scalp laceration 10/22/2015  . Multiple fractures of cervical spine ([place]) 10/22/2015  . Parkinson's disease ([place]) 10/22/2015  . TBI (traumatic brain injury) ([place]) 10/20/2015  .  Breast cancer, right breast ([place]) 11/20/2012    Past Surgical History:  Procedure Laterality Date  . ANTERIOR RELEASE VERTEBRAL BODY W/ POSTERIOR FUSION  2010   L1-S1  . CESAREAN SECTION  1975  . lumpectomy right Right 1998   stage IA, DCIS with micrinvasion, ER+/PR+, , right breast lower inner quadrant  . SPINAL FUSION      OB History    No data available       Home Medications    Prior to Admission medications   Medication Sig Start Date End Date Taking? Authorizing Provider  alendronate (FOSAMAX) 70 MG tablet Take 70 mg by mouth every Monday. Take with a full glass of water on an empty stomach.   Yes [provider]  aspirin EC 81 MG tablet Take 81 mg by mouth daily.   Yes [provider]  buPROPion (WELLBUTRIN XL) 300 MG 24 hr tablet Take 300 mg by mouth every morning.    Yes [provider]  calcium citrate-vitamin D (CITRACAL+D) 315-200 MG-UNIT tablet Take 2 tablets by mouth daily.   Yes [provider]  Coenzyme Q10 (EQL COQ10) 300 MG CAPS Take 2,400 mg by mouth daily.   Yes [provider]  glycopyrrolate (ROBINUL) 1 MG tablet Take 1 mg by mouth 2 (two) times daily. 12/22/16  Yes [provider]  LORazepam (ATIVAN) 0.5 MG tablet Take 0.5 mg by mouth every 8 (eight) hours as needed for anxiety or sleep.  12/22/14  Yes [provider]  Multiple Vitamin (MULTIVITAMIN WITH MINERALS)  TABS tablet Take 1 tablet by mouth daily.   Yes [provider]  Omega-3 Fatty Acids (FISH OIL BURP-LESS PO) Take 5 mLs by mouth daily.   Yes [provider]  oxyCODONE-acetaminophen (PERCOCET) 10-325 MG tablet Take 1 tablet by mouth 3 (three) times daily. 03/18/17  Yes [provider]  PARoxetine (PAXIL) 30 MG tablet Take 30 mg by mouth daily.   Yes [provider]  polyethylene glycol (MIRALAX / GLYCOLAX) packet Take 17 g by mouth daily.   Yes [provider]  valsartan (DIOVAN) 80 MG tablet  Take 80 mg by mouth daily.   Yes [provider]  zolpidem (AMBIEN) 10 MG tablet Take 10 mg by mouth at bedtime.  08/28/13  Yes [provider]  cephALEXin (KEFLEX) 500 MG capsule Take 1 capsule (500 mg total) by mouth 4 (four) times daily. Patient not taking: Reported on 01/22/2017 06/03/16   Veryl Speak, MD    Family History Family History  Problem Relation Age of Onset  . Stroke Father   . Cancer Sister   . Cancer Son 82       leiomyosarcoma    Social History Social History  Substance Use Topics  . Smoking status: Never Smoker  . Smokeless tobacco: Never Used  . Alcohol use 0.0 oz/week     Allergies   Amantadines; Codeine; Demerol [meperidine]; and Valium [diazepam]   Review of Systems Review of Systems  Unable to perform ROS: Patient nonverbal  Constitutional: Negative for fever.  Respiratory: Negative for cough.   Cardiovascular: Negative for chest pain.  Gastrointestinal: Negative for abdominal pain, diarrhea and vomiting.   Physical Exam Updated Vital Signs BP (!) 150/79   Pulse 95   Temp 98.4 F (36.9 C) (Axillary)   Resp (!) 25   SpO2 97%   Physical Exam  Constitutional: She appears well-developed. She appears ill (chronically ill appearance). No distress.  HENT:  Head: Normocephalic and atraumatic.  Eyes: EOM are normal.  Neck: Normal range of motion.  Cardiovascular: Normal rate, regular rhythm and normal heart sounds.   Pulmonary/Chest: Effort normal and breath sounds normal.  Abdominal: Soft. She exhibits no distension. There is no tenderness.  Musculoskeletal: Normal range of motion.  Neurological: She is alert.  Alert, partial lid apraxia, left fingers curled, hypertonia  Skin: Skin is warm and dry.  Psychiatric: She has a normal mood and affect. Judgment normal.  Nursing note and vitals reviewed.   ED Treatments / Results  DIAGNOSTIC STUDIES:  Oxygen Saturation is 96% on RA, normal by my interpretation.     COORDINATION OF CARE:  12:23 AM Discussed treatment plan with pt and husband at bedside and pt/husband agreed to plan.   Labs (all labs ordered are listed, but only abnormal results are displayed) Labs Reviewed  CBC WITH DIFFERENTIAL/PLATELET - Abnormal; Notable for the following:       Result Value   Hemoglobin 11.7 (*)    All other components within normal limits  COMPREHENSIVE METABOLIC PANEL - Abnormal; Notable for the following:    Glucose, Bld 106 (*)    BUN 21 (*)    All other components within normal limits  URINALYSIS, ROUTINE W REFLEX MICROSCOPIC    EKG  EKG Interpretation  Date/Time:  Thursday April 14 2017 22:40:18 EDT Ventricular Rate:  103 PR Interval:    QRS Duration: 75 QT Interval:  339 QTC Calculation: 444 R Axis:   36 Text Interpretation:  Sinus tachycardia Probable left atrial enlargement LVH  with secondary repolarization abnormality ST depression, consider ischemia, diffuse lds Artifact in lead(s) I II III aVR aVL aVF V1 V2 V3 V4 No significant change since last tracing with exception of artifact Confirmed by Miranda [name] [phone]) on 04/14/2017 11:19:21 PM       Radiology No results found.  Procedures Procedures (including critical care time)  Medications Ordered in ED Medications - No data to display   Initial Impression / Assessment and Plan / ED Course  I have reviewed the triage vital signs and the nursing notes.  Pertinent labs & imaging results that were available during my care of the patient were reviewed by me and considered in my medical decision making (see chart for details).     71yo female with history of breast cancer s/p right lumpectomy, hypertension, progressive supranuclear palsy/Richardson syndrome who presents with spell of deep uncontrolled sniffing.  Patient nonverbal but able to spell out words, reports she is back to baseline and does not have pain and husband agrees.  No sign of UTI, No sign of significant  electrloyte abnormality. Unclear etiology of episode, however seizure is possibility.  Given pt at baseline after one episode tonight, feel she is appropriate for continued close outpatient monitoring and Neurology follow up. Patient discharged in stable condition with understanding of reasons to return.   Final Clinical Impressions(s) / ED Diagnoses   Final diagnoses:  Nonspecific paroxysmal spell    New Prescriptions Discharge Medication List as of 04/15/2017  1:57 AM     I personally performed the services described in this documentation, which was scribed in my presence. The recorded information has been reviewed and is accurate.    Miranda Morgan, MD 04/17/17 [phone]

## 2017-04-15 DIAGNOSIS — R404 Transient alteration of awareness: Secondary | ICD-10-CM | POA: Diagnosis not present

## 2017-04-15 LAB — URINALYSIS, ROUTINE W REFLEX MICROSCOPIC
BILIRUBIN URINE: NEGATIVE
Glucose, UA: NEGATIVE mg/dL
HGB URINE DIPSTICK: NEGATIVE
KETONES UR: NEGATIVE mg/dL
Leukocytes, UA: NEGATIVE
NITRITE: NEGATIVE
PROTEIN: NEGATIVE mg/dL
Specific Gravity, Urine: 1.014 (ref 1.005–1.030)
pH: 6 (ref 5.0–8.0)

## 2017-04-15 LAB — COMPREHENSIVE METABOLIC PANEL
ALT: 22 U/L (ref 14–54)
AST: 29 U/L (ref 15–41)
Albumin: 3.9 g/dL (ref 3.5–5.0)
Alkaline Phosphatase: 57 U/L (ref 38–126)
Anion gap: 8 (ref 5–15)
BILIRUBIN TOTAL: 0.6 mg/dL (ref 0.3–1.2)
BUN: 21 mg/dL — AB (ref 6–20)
CALCIUM: 9.2 mg/dL (ref 8.9–10.3)
CO2: 29 mmol/L (ref 22–32)
CREATININE: 0.85 mg/dL (ref 0.44–1.00)
Chloride: 101 mmol/L (ref 101–111)
Glucose, Bld: 106 mg/dL — ABNORMAL HIGH (ref 65–99)
Potassium: 4.1 mmol/L (ref 3.5–5.1)
Sodium: 138 mmol/L (ref 135–145)
TOTAL PROTEIN: 6.6 g/dL (ref 6.5–8.1)

## 2017-04-15 LAB — CBC WITH DIFFERENTIAL/PLATELET
BASOS ABS: 0 10*3/uL (ref 0.0–0.1)
Basophils Relative: 1 %
EOS PCT: 1 %
Eosinophils Absolute: 0 10*3/uL (ref 0.0–0.7)
HEMATOCRIT: 36.1 % (ref 36.0–46.0)
Hemoglobin: 11.7 g/dL — ABNORMAL LOW (ref 12.0–15.0)
LYMPHS ABS: 1.1 10*3/uL (ref 0.7–4.0)
LYMPHS PCT: 13 %
MCH: 29.5 pg (ref 26.0–34.0)
MCHC: 32.4 g/dL (ref 30.0–36.0)
MCV: 91.2 fL (ref 78.0–100.0)
MONO ABS: 0.7 10*3/uL (ref 0.1–1.0)
Monocytes Relative: 9 %
NEUTROS ABS: 6.2 10*3/uL (ref 1.7–7.7)
Neutrophils Relative %: 76 %
PLATELETS: 394 10*3/uL (ref 150–400)
RBC: 3.96 MIL/uL (ref 3.87–5.11)
RDW: 13.6 % (ref 11.5–15.5)
WBC: 8 10*3/uL (ref 4.0–10.5)

## 2017-08-22 ENCOUNTER — Emergency Department (HOSPITAL_COMMUNITY)
Admission: EM | Admit: 2017-08-22 | Discharge: 2017-08-22 | Disposition: A | Discharge status: Home / Self Care | Attending: Emergency Medicine | Admitting: Emergency Medicine

## 2017-08-22 ENCOUNTER — Encounter (HOSPITAL_COMMUNITY): Payer: Self-pay

## 2017-08-22 DIAGNOSIS — S0990XA Unspecified injury of head, initial encounter: Secondary | ICD-10-CM | POA: Diagnosis present

## 2017-08-22 DIAGNOSIS — I10 Essential (primary) hypertension: Secondary | ICD-10-CM | POA: Diagnosis not present

## 2017-08-22 DIAGNOSIS — S0101XA Laceration without foreign body of scalp, initial encounter: Secondary | ICD-10-CM | POA: Insufficient documentation

## 2017-08-22 DIAGNOSIS — Y999 Unspecified external cause status: Secondary | ICD-10-CM | POA: Diagnosis not present

## 2017-08-22 DIAGNOSIS — G20 Parkinson's disease: Secondary | ICD-10-CM | POA: Diagnosis not present

## 2017-08-22 DIAGNOSIS — Z79899 Other long term (current) drug therapy: Secondary | ICD-10-CM | POA: Insufficient documentation

## 2017-08-22 DIAGNOSIS — F0390 Unspecified dementia without behavioral disturbance: Secondary | ICD-10-CM | POA: Diagnosis not present

## 2017-08-22 DIAGNOSIS — W19XXXA Unspecified fall, initial encounter: Secondary | ICD-10-CM

## 2017-08-22 DIAGNOSIS — Y929 Unspecified place or not applicable: Secondary | ICD-10-CM | POA: Insufficient documentation

## 2017-08-22 DIAGNOSIS — Y939 Activity, unspecified: Secondary | ICD-10-CM | POA: Insufficient documentation

## 2017-08-22 DIAGNOSIS — W06XXXA Fall from bed, initial encounter: Secondary | ICD-10-CM | POA: Diagnosis not present

## 2017-08-22 DIAGNOSIS — Z853 Personal history of malignant neoplasm of breast: Secondary | ICD-10-CM | POA: Insufficient documentation

## 2017-08-22 DIAGNOSIS — Y92009 Unspecified place in unspecified non-institutional (private) residence as the place of occurrence of the external cause: Secondary | ICD-10-CM

## 2017-08-22 NOTE — ED Notes (Signed)
Hospice at bedside, pt is requesting to not have a head ct done just to have her laceration repaired and then to go home.

## 2017-08-22 NOTE — ED Notes (Signed)
Patient verbalizes understanding of discharge instructions. Opportunity for questioning and answers were provided. 

## 2017-08-22 NOTE — ED Provider Notes (Signed)
[facility] EMERGENCY DEPARTMENT Provider Note   CSN: [id number] Arrival date & time: 08/22/17  1029     History   Chief Complaint Chief Complaint  Patient presents with  . Fall  . Head Injury    HPI Miranda [name] is a 71 y.o. female.  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  The patient fell out of her bed today and struck her head.  Husband reports that it was bleeding and he was concerned that she might have a laceration that needed stitches.  There has been no change from baseline. Past Medical History:  Diagnosis Date  . Breast cancer ([place]) 1998   lumpectomy  . Depression   . Hypertension   . Multiple falls   . Parkinson's disease ([place]) 2012  . 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 Fall 10/22/2015  . Scalp laceration 10/22/2015  . Multiple fractures of cervical spine ([place]) 10/22/2015  . Parkinson's disease ([place]) 10/22/2015  . TBI (traumatic brain injury) ([place]) 10/20/2015  . Breast cancer, right breast ([place]) 11/20/2012    Past Surgical History:  Procedure Laterality Date  . ANTERIOR RELEASE VERTEBRAL BODY W/ POSTERIOR FUSION  2010   L1-S1  . CESAREAN SECTION  1975  . lumpectomy right Right 1998   stage IA, DCIS with micrinvasion, ER+/PR+, , right breast lower inner quadrant  . SPINAL FUSION      OB History    No data available       Home Medications    Prior to Admission medications   Medication Sig Start Date End Date Taking? Authorizing Provider  buPROPion (WELLBUTRIN XL) 300 MG 24 hr tablet Take 300 mg by mouth every morning.    Yes [provider]  glycopyrrolate (ROBINUL) 1 MG tablet Take 1 mg by mouth 2 (two) times daily.  12/22/16  Yes [provider]  LORazepam (ATIVAN) 0.5 MG tablet Take 0.5 mg by mouth every 8 (eight) hours as needed for anxiety or sleep.  12/22/14  Yes [provider]  Morphine Sulfate (MORPHINE  CONCENTRATE) 10 mg / 0.5 ml concentrated solution Take 5-10 mg by mouth every 4 (four) hours as needed for severe pain.   Yes [provider]  Omega-3 Fatty Acids (FISH OIL BURP-LESS PO) Take 5 mLs by mouth daily.   Yes [provider]  PARoxetine (PAXIL) 30 MG tablet Take 30 mg by mouth daily.   Yes [provider]  senna (SENOKOT) 8.6 MG TABS tablet Take 2 tablets by mouth daily as needed for mild constipation.   Yes [provider]  valsartan (DIOVAN) 80 MG tablet Take 80 mg by mouth daily.   Yes [provider]  zolpidem (AMBIEN) 10 MG tablet Take 10 mg by mouth at bedtime.  08/28/13  Yes [provider]  cephALEXin (KEFLEX) 500 MG capsule Take 1 capsule (500 mg total) by mouth 4 (four) times daily. Patient not taking: Reported on 01/22/2017 06/03/16   Veryl Speak, MD    Family History Family History  Problem Relation Age of Onset  . Stroke Father   . Cancer Sister   . Cancer Son 55       leiomyosarcoma    Social History Social History  Substance Use Topics  . Smoking status: Never Smoker  . Smokeless tobacco: Never Used  . Alcohol use 0.0 oz/week     Allergies   Amantadines; Codeine; Demerol [  meperidine]; and Valium [diazepam]   Review of Systems Review of Systems  Level 5 caveat cannot obtain review of systems due to patient dementia Physical Exam Updated Vital Signs BP (!) 150/69   Pulse 95   Temp 98.3 F (36.8 C) (Oral)   Resp (!) 41   SpO2 100%   Physical Exam  Constitutional:  Patient is alert and in no distress.  She has a significantly contorted position of her head and shoulders due to her underlying medical condition.  HENT:  Right parietal scalp has a 1 cm superficial laceration without active bleeding.  This was cleaned and examined.  There is no penetration beyond the superficial dermis level.  No associated hematoma  Pulmonary/Chest: Effort normal.  Neurological: She is alert.  Skin: Skin is  warm and dry.     ED Treatments / Results  Labs (all labs ordered are listed, but only abnormal results are displayed) Labs Reviewed - No data to display  EKG  EKG Interpretation None       Radiology No results found.  Procedures Procedures (including critical care time)  Medications Ordered in ED Medications - No data to display   Initial Impression / Assessment and Plan / ED Course  I have reviewed the triage vital signs and the nursing notes.  Pertinent labs & imaging results that were available during my care of the patient were reviewed by me and considered in my medical decision making (see chart for details).      Final Clinical Impressions(s) / ED Diagnoses   Final diagnoses:  Fall in home, initial encounter  Laceration of scalp, initial encounter   Patient is in hospice care due to progressive and severe neurologic disorder.  Patient's husband does not feel that CT is needed.  She is at baseline for mental status and function.  Wound was cleaned and at this time no sutures are needed. New Prescriptions New Prescriptions   No medications on file     Charlesetta Shanks, MD 08/22/17 1314

## 2017-08-22 NOTE — ED Triage Notes (Signed)
Per family pt was in bed, attempted to get our of bed without assistance and fell striking her head. She has a laceration to the right side of her head. No LOC. Pt does not take blood thinners.

## 2017-08-22 NOTE — ED Notes (Signed)
Suture cart placed outside pt room. Head laceration cleaned by RN

## 2017-08-22 NOTE — ED Notes (Signed)
ED Provider at bedside. 

## 2017-08-22 NOTE — Progress Notes (Signed)
[facility] ED D33- [facility] ED RN Visit  Received phone call from patient's homecare hospice RN, Joseph Art, notifying that patient was on way to ED following a fall out of bed this morning, resulting in a laceration to right side of head.  Per discussion, patient has a history of falls, including multiple in the past couple days, including hitting her head in the bathroom, after sliding off her toilet at 1 AM.  The laceration occurred at approximately 7 AM, when patient fell out of her bed and hit the nightstand.  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  She has an [facility] DNR.  Patient seen in room, with husband Bill at bedside.  Patient alert and responding by squeezing her husband's hand, which he reports is her baseline.  Discussed goals of care re: expectations this ED visit.  Husband stated he wishes for the laceration to her scalp to be repaired.  He reported that he feels a head CT or further-workup may not be necessary given the progression of her underlying disease process.  Patient resides at home with her husband, with the support of caregivers and hospice.  Discussed the conversation re: patient and husband wishes with the MD and staff RN.  Provided emotional support to husband and patient.  Provided contact information if any needs arise.  Provided copy of updated medication list to pharmacy tech.  Please feel free to contact with any hospice-related questions or concerns.  Thank you, Freddi Starr, RN BSN [facility] Liaison [phone]

## 2018-04-01 DEATH — deceased
